# Patient Record
Sex: Male | Born: 1960 | Race: White | Hispanic: No | Smoking: Never smoker
Health system: Southern US, Community
[De-identification: ages and names within clinical notes are randomized; demographics above are authoritative.]

## PROBLEM LIST (undated history)

## (undated) DIAGNOSIS — E119 Type 2 diabetes mellitus without complications: Secondary | ICD-10-CM

## (undated) HISTORY — PX: FRACTURE SURGERY: SHX138

## (undated) HISTORY — DX: Type 2 diabetes mellitus without complications: E11.9

---

## 2006-02-22 ENCOUNTER — Inpatient Hospital Stay: Payer: Self-pay | Admitting: General Practice

## 2006-02-22 ENCOUNTER — Other Ambulatory Visit: Payer: Self-pay

## 2006-04-19 ENCOUNTER — Inpatient Hospital Stay: Payer: Self-pay | Admitting: General Practice

## 2006-04-19 ENCOUNTER — Other Ambulatory Visit: Payer: Self-pay

## 2006-05-10 ENCOUNTER — Encounter: Payer: Self-pay | Admitting: Internal Medicine

## 2006-06-01 ENCOUNTER — Encounter: Payer: Self-pay | Admitting: Internal Medicine

## 2006-07-02 ENCOUNTER — Encounter: Payer: Self-pay | Admitting: Internal Medicine

## 2006-10-08 ENCOUNTER — Encounter: Payer: Self-pay | Admitting: General Practice

## 2006-11-01 ENCOUNTER — Encounter: Payer: Self-pay | Admitting: General Practice

## 2006-11-12 ENCOUNTER — Ambulatory Visit: Payer: Self-pay | Admitting: General Practice

## 2006-11-12 IMAGING — US US EXTREM LOW VENOUS*R*
1 series · 18 of 19 positions shown · non-contrast
Comparison: none

REASON FOR EXAM: Right leg swelling, evaluate for DVT
       Call report to: [[email or web address]_NUMBER]
COMMENTS:

PROCEDURE:     US  - US DOPPLER LOW EXTR RIGHT  - [DATE]  [DATE]
RESULT:     The RIGHT femoral and popliteal veins are normally compressible.
The waveform patterns are normal and the color flow images are normal.

[Series 1: us extrem low venous*right* · 18 of 19 slices shown]
[im 1/19]
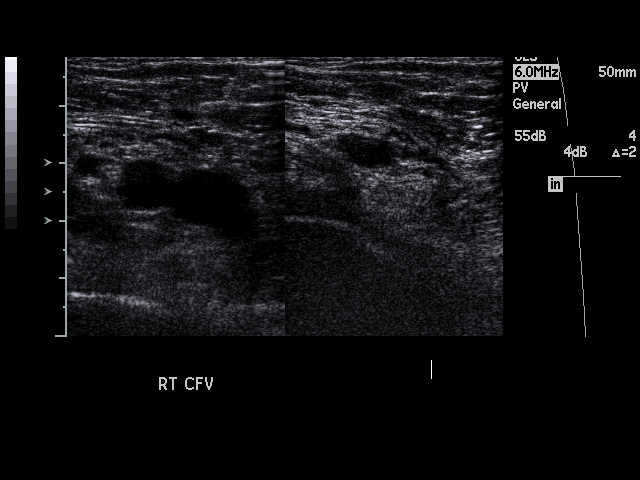
[im 2/19]
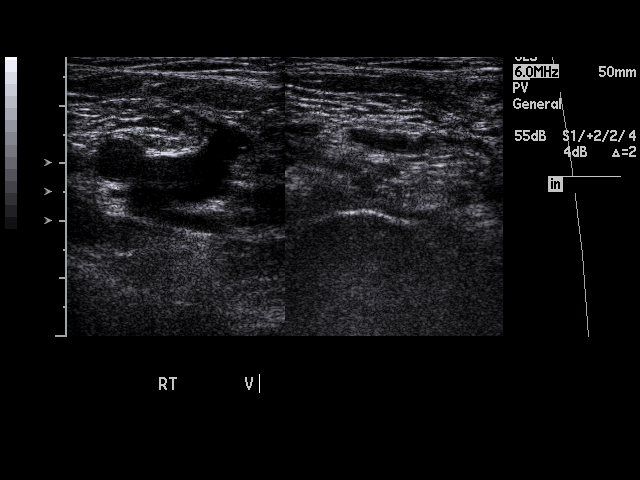
[im 3/19]
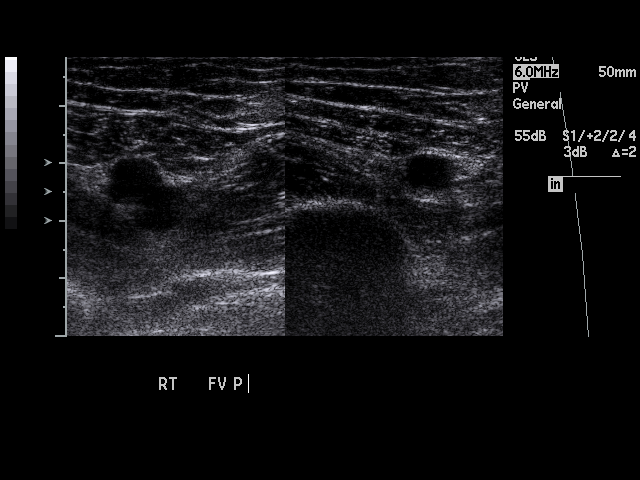
[im 4/19]
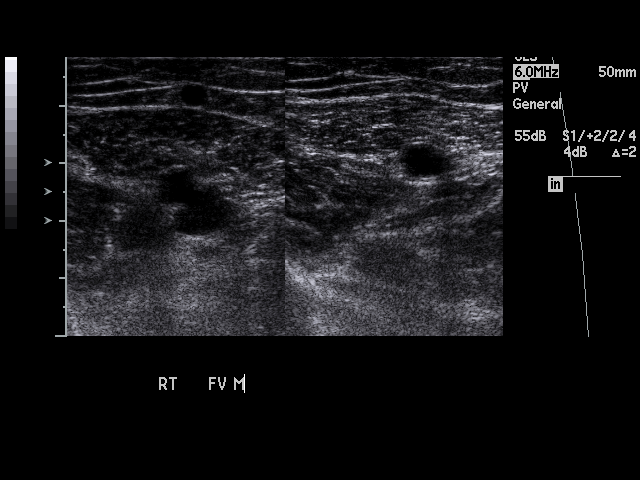
[im 5/19]
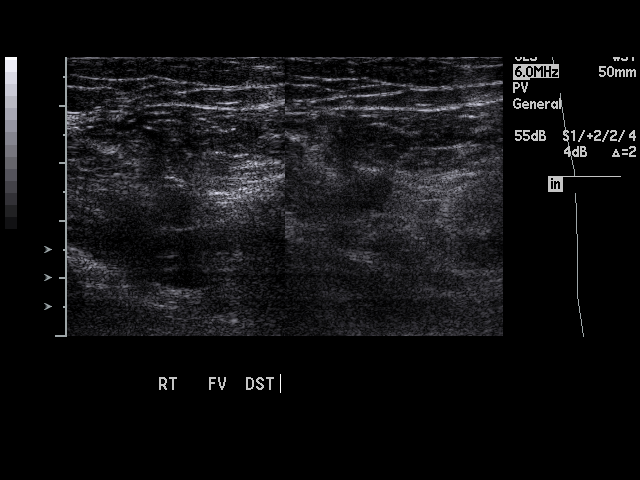
[im 6/19]
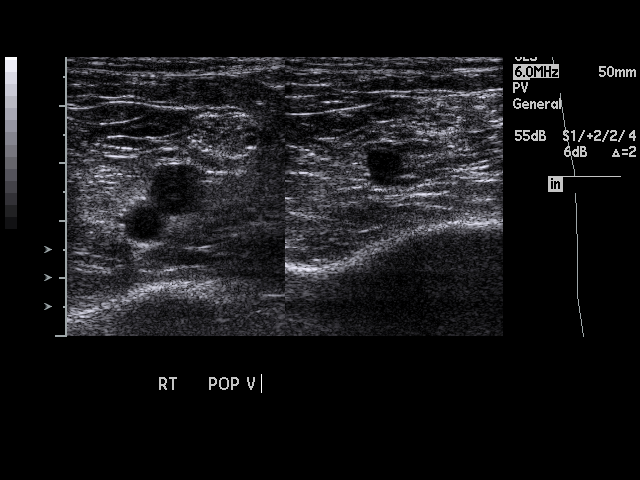
[im 7/19]
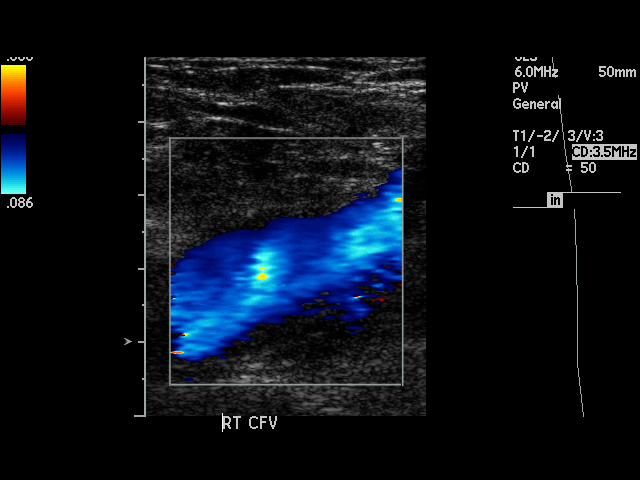
[im 8/19]
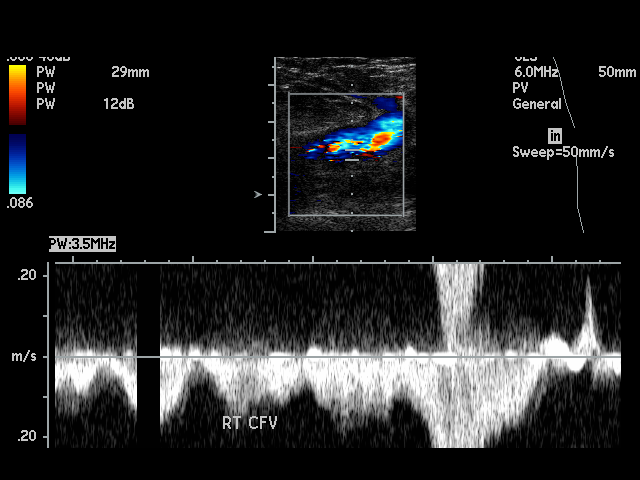
[im 9/19]
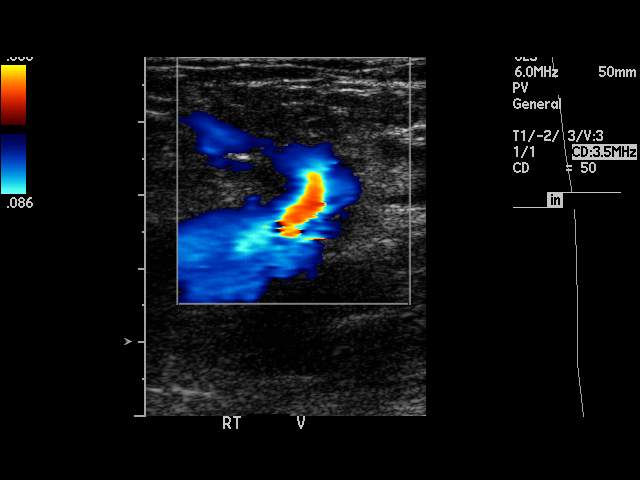
[im 11/19]
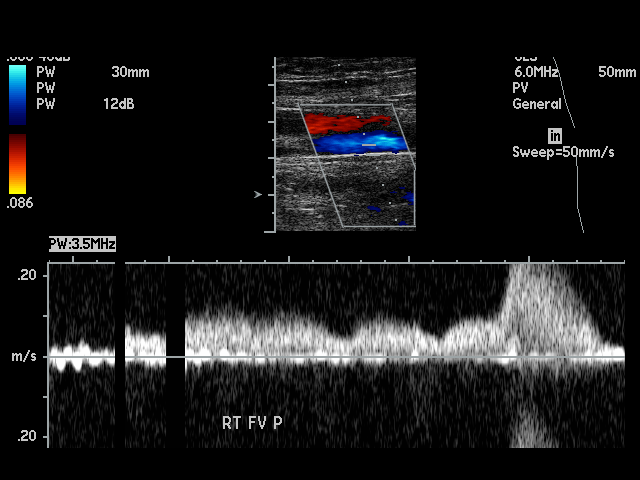
[im 12/19]
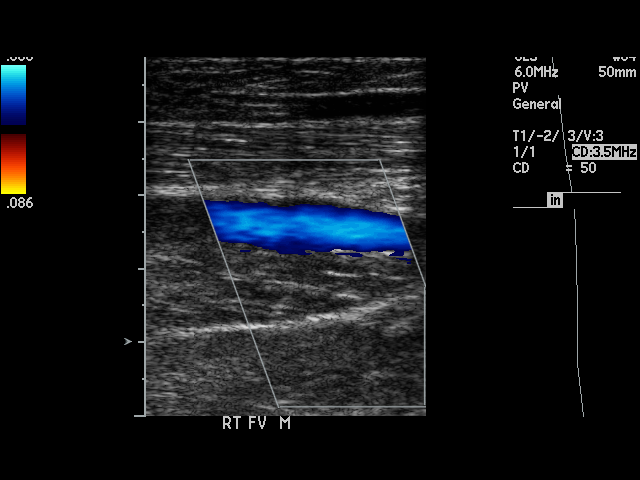
[im 13/19]
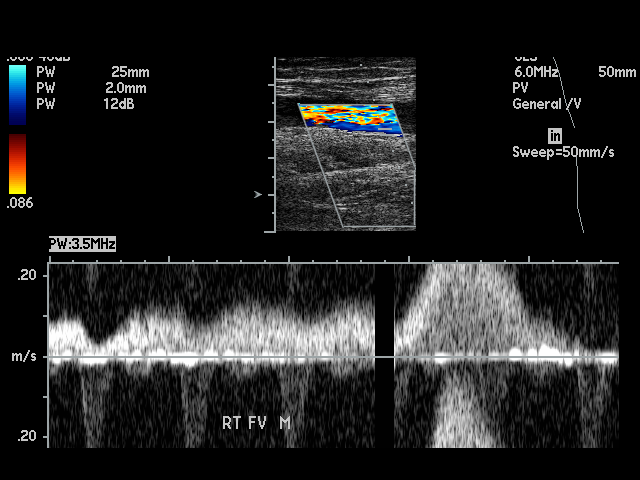
[im 14/19]
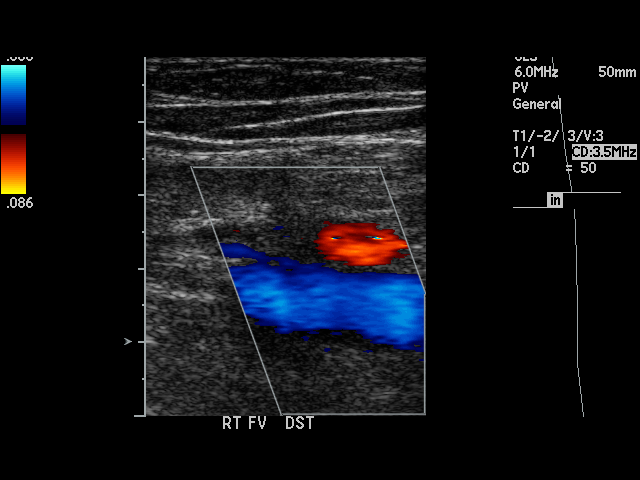
[im 15/19]
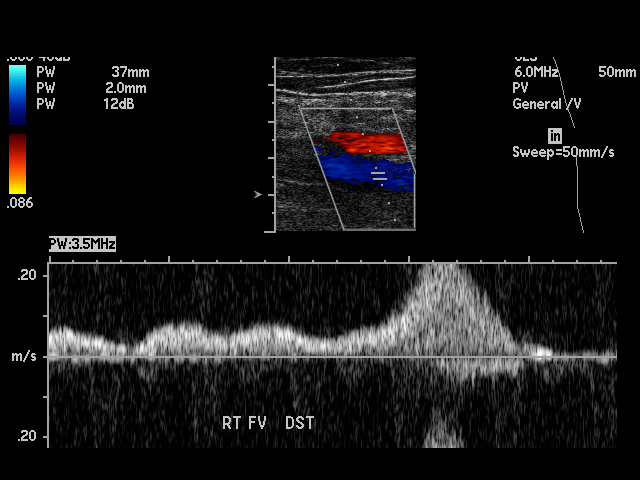
[im 16/19]
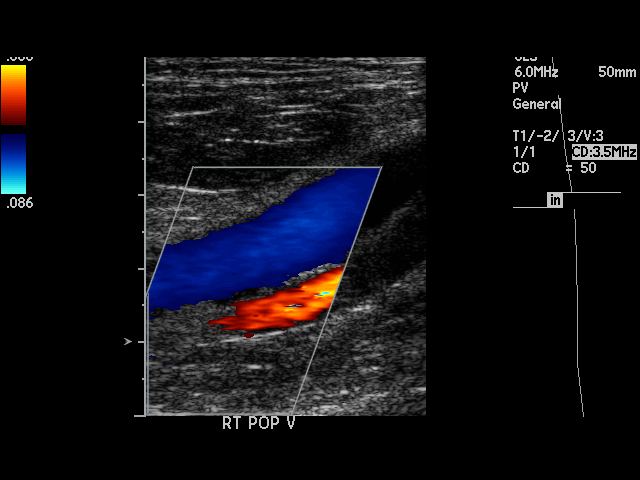
[im 17/19]
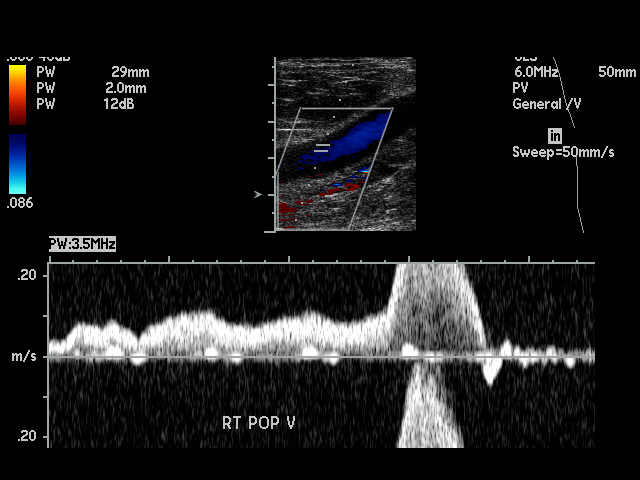
[im 18/19]
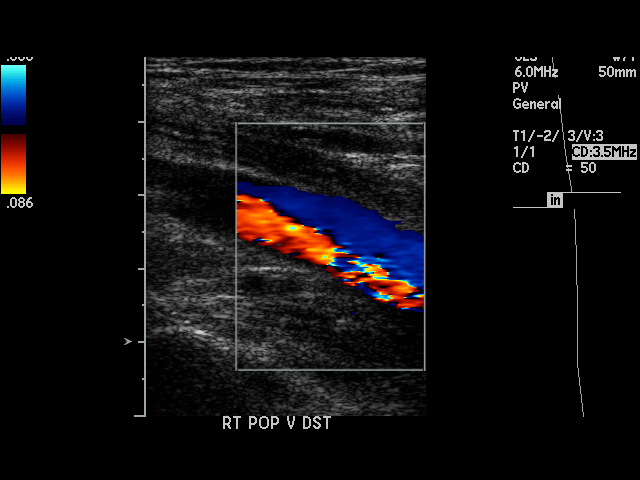
[im 19/19]
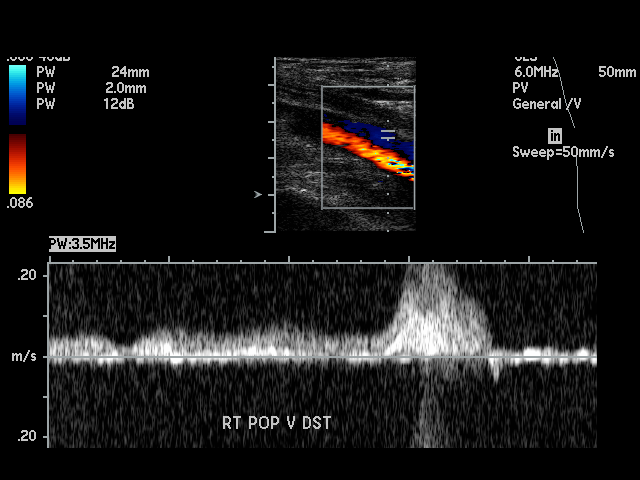

[18 of 19 positions shown; findings below may reference images not displayed]

IMPRESSION: I do not see evidence of thrombus within the RIGHT femoral
or popliteal veins.

## 2006-12-02 ENCOUNTER — Encounter: Payer: Self-pay | Admitting: General Practice

## 2007-01-01 ENCOUNTER — Encounter: Payer: Self-pay | Admitting: General Practice

## 2007-02-07 ENCOUNTER — Ambulatory Visit: Payer: Self-pay | Admitting: Podiatry

## 2022-02-11 ENCOUNTER — Inpatient Hospital Stay
Admission: EM | Admit: 2022-02-11 | Discharge: 2022-02-14 | DRG: 481 | Disposition: A | Discharge status: Skilled Nursing Facility | Payer: Medicare Other | Attending: Internal Medicine | Admitting: Internal Medicine

## 2022-02-11 ENCOUNTER — Emergency Department: Payer: Medicare Other

## 2022-02-11 ENCOUNTER — Encounter: Payer: Self-pay | Admitting: Internal Medicine

## 2022-02-11 ENCOUNTER — Other Ambulatory Visit: Payer: Self-pay

## 2022-02-11 ENCOUNTER — Inpatient Hospital Stay: Payer: Medicare Other

## 2022-02-11 DIAGNOSIS — S72141A Displaced intertrochanteric fracture of right femur, initial encounter for closed fracture: Principal | ICD-10-CM | POA: Diagnosis present

## 2022-02-11 DIAGNOSIS — E876 Hypokalemia: Secondary | ICD-10-CM | POA: Diagnosis present

## 2022-02-11 DIAGNOSIS — R739 Hyperglycemia, unspecified: Secondary | ICD-10-CM | POA: Diagnosis present

## 2022-02-11 DIAGNOSIS — Y92008 Other place in unspecified non-institutional (private) residence as the place of occurrence of the external cause: Secondary | ICD-10-CM | POA: Diagnosis not present

## 2022-02-11 DIAGNOSIS — R7401 Elevation of levels of liver transaminase levels: Secondary | ICD-10-CM | POA: Diagnosis not present

## 2022-02-11 DIAGNOSIS — S72001A Fracture of unspecified part of neck of right femur, initial encounter for closed fracture: Principal | ICD-10-CM

## 2022-02-11 DIAGNOSIS — D62 Acute posthemorrhagic anemia: Secondary | ICD-10-CM | POA: Diagnosis not present

## 2022-02-11 DIAGNOSIS — W19XXXA Unspecified fall, initial encounter: Secondary | ICD-10-CM | POA: Diagnosis present

## 2022-02-11 DIAGNOSIS — E119 Type 2 diabetes mellitus without complications: Secondary | ICD-10-CM | POA: Diagnosis not present

## 2022-02-11 DIAGNOSIS — E663 Overweight: Secondary | ICD-10-CM | POA: Diagnosis present

## 2022-02-11 DIAGNOSIS — E1165 Type 2 diabetes mellitus with hyperglycemia: Secondary | ICD-10-CM | POA: Diagnosis present

## 2022-02-11 DIAGNOSIS — L97519 Non-pressure chronic ulcer of other part of right foot with unspecified severity: Secondary | ICD-10-CM | POA: Diagnosis present

## 2022-02-11 DIAGNOSIS — E11621 Type 2 diabetes mellitus with foot ulcer: Secondary | ICD-10-CM | POA: Diagnosis present

## 2022-02-11 DIAGNOSIS — R17 Unspecified jaundice: Secondary | ICD-10-CM | POA: Diagnosis present

## 2022-02-11 DIAGNOSIS — Z6827 Body mass index (BMI) 27.0-27.9, adult: Secondary | ICD-10-CM

## 2022-02-11 DIAGNOSIS — W010XXA Fall on same level from slipping, tripping and stumbling without subsequent striking against object, initial encounter: Secondary | ICD-10-CM | POA: Diagnosis present

## 2022-02-11 LAB — CBC WITH DIFFERENTIAL/PLATELET
Abs Immature Granulocytes: 0.06 10*3/uL (ref 0.00–0.07)
Basophils Absolute: 0.1 10*3/uL (ref 0.0–0.1)
Basophils Relative: 1 %
Eosinophils Absolute: 0 10*3/uL (ref 0.0–0.5)
Eosinophils Relative: 0 %
HCT: 37.1 % — ABNORMAL LOW (ref 39.0–52.0)
Hemoglobin: 13.5 g/dL (ref 13.0–17.0)
Immature Granulocytes: 1 %
Lymphocytes Relative: 12 %
Lymphs Abs: 1.1 10*3/uL (ref 0.7–4.0)
MCH: 34 pg (ref 26.0–34.0)
MCHC: 36.4 g/dL — ABNORMAL HIGH (ref 30.0–36.0)
MCV: 93.5 fL (ref 80.0–100.0)
Monocytes Absolute: 0.9 10*3/uL (ref 0.1–1.0)
Monocytes Relative: 9 %
Neutro Abs: 7.2 10*3/uL (ref 1.7–7.7)
Neutrophils Relative %: 77 %
Platelets: 260 10*3/uL (ref 150–400)
RBC: 3.97 MIL/uL — ABNORMAL LOW (ref 4.22–5.81)
RDW: 13.3 % (ref 11.5–15.5)
WBC: 9.3 10*3/uL (ref 4.0–10.5)
nRBC: 0 % (ref 0.0–0.2)

## 2022-02-11 LAB — COMPREHENSIVE METABOLIC PANEL
ALT: 111 U/L — ABNORMAL HIGH (ref 0–44)
AST: 60 U/L — ABNORMAL HIGH (ref 15–41)
Albumin: 4.3 g/dL (ref 3.5–5.0)
Alkaline Phosphatase: 73 U/L (ref 38–126)
Anion gap: 15 (ref 5–15)
BUN: 18 mg/dL (ref 8–23)
CO2: 23 mmol/L (ref 22–32)
Calcium: 9.6 mg/dL (ref 8.9–10.3)
Chloride: 94 mmol/L — ABNORMAL LOW (ref 98–111)
Creatinine, Ser: 0.78 mg/dL (ref 0.61–1.24)
GFR, Estimated: >60 mL/min (ref >60)
Glucose, Bld: 173 mg/dL — ABNORMAL HIGH (ref 70–99)
Potassium: 3.7 mmol/L (ref 3.5–5.1)
Sodium: 132 mmol/L — ABNORMAL LOW (ref 135–145)
Total Bilirubin: 2.9 mg/dL — ABNORMAL HIGH (ref 0.3–1.2)
Total Protein: 7.4 g/dL (ref 6.5–8.1)

## 2022-02-11 LAB — GLUCOSE, CAPILLARY: Glucose-Capillary: 134 mg/dL — ABNORMAL HIGH (ref 70–99)

## 2022-02-11 LAB — CK: Total CK: 147 U/L (ref 49–397)

## 2022-02-11 LAB — CBG MONITORING, ED: Glucose-Capillary: 153 mg/dL — ABNORMAL HIGH (ref 70–99)

## 2022-02-11 MED ORDER — SENNA 8.6 MG PO TABS
1.0000 | ORAL_TABLET | Freq: Two times a day (BID) | ORAL | Status: DC
  Administered 2022-02-13 – 2022-02-14 (×3): 8.6 mg via ORAL
  Filled 2022-02-11 (×5): qty 1

## 2022-02-11 MED ORDER — HYDROMORPHONE HCL 1 MG/ML IJ SOLN
1.0000 mg | Freq: Once | INTRAMUSCULAR | Status: AC
  Administered 2022-02-11: 1 mg via INTRAVENOUS
  Filled 2022-02-11: qty 1

## 2022-02-11 MED ORDER — AMLODIPINE BESYLATE 5 MG PO TABS
5.0000 mg | ORAL_TABLET | Freq: Every day | ORAL | Status: DC
  Administered 2022-02-11 – 2022-02-14 (×3): 5 mg via ORAL
  Filled 2022-02-11 (×3): qty 1

## 2022-02-11 MED ORDER — MORPHINE SULFATE (PF) 2 MG/ML IV SOLN
0.5000 mg | INTRAVENOUS | Status: DC | PRN
  Administered 2022-02-11 – 2022-02-12 (×2): 0.5 mg via INTRAVENOUS
  Filled 2022-02-11 (×2): qty 1

## 2022-02-11 MED ORDER — HYDROMORPHONE HCL 1 MG/ML IJ SOLN
0.5000 mg | Freq: Once | INTRAMUSCULAR | Status: AC
  Administered 2022-02-11: 0.5 mg via INTRAVENOUS
  Filled 2022-02-11: qty 0.5

## 2022-02-11 MED ORDER — ONDANSETRON HCL 4 MG/2ML IJ SOLN
4.0000 mg | Freq: Once | INTRAMUSCULAR | Status: AC
  Administered 2022-02-11: 4 mg via INTRAVENOUS
  Filled 2022-02-11: qty 2

## 2022-02-11 MED ORDER — HYDROCODONE-ACETAMINOPHEN 5-325 MG PO TABS
1.0000 | ORAL_TABLET | Freq: Four times a day (QID) | ORAL | Status: DC | PRN
  Administered 2022-02-11 – 2022-02-12 (×2): 2 via ORAL
  Filled 2022-02-11 (×3): qty 2

## 2022-02-11 MED ORDER — CEFAZOLIN SODIUM-DEXTROSE 2-4 GM/100ML-% IV SOLN
2.0000 g | INTRAVENOUS | Status: AC
  Administered 2022-02-12: 2 g via INTRAVENOUS

## 2022-02-11 MED ORDER — METHOCARBAMOL 500 MG PO TABS
500.0000 mg | ORAL_TABLET | Freq: Four times a day (QID) | ORAL | Status: DC | PRN
  Administered 2022-02-11 – 2022-02-14 (×4): 500 mg via ORAL
  Filled 2022-02-11 (×4): qty 1

## 2022-02-11 MED ORDER — METHOCARBAMOL 1000 MG/10ML IJ SOLN: 500.0000 mg | Freq: Four times a day (QID) | INTRAVENOUS | Status: DC | PRN

## 2022-02-11 NOTE — Assessment & Plan Note (Signed)
Patient noted to have transaminitis of unclear etiology.  Unclear etiology.  Mildly elevated with AST of 60 and ALT of 111 on admission.  Right upper quadrant ultrasound unremarkable.  We will recheck labs in the morning.

## 2022-02-11 NOTE — Consult Note (Signed)
ORTHOPAEDIC CONSULTATION  REQUESTING PHYSICIAN: Lucile Shutters, MD  Chief Complaint:   Right hip pain.  History of Present Illness: Terrence Sanchez is a 61 y.o. 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  Apparently, the patient lost his balance and fell onto his right side 1 week ago.  He did not seek treatment immediately, hoping that "it would get better on its own".  However, because of continued pain and inability to ambulate, he was brought to the emergency room where x-rays demonstrated a displaced intertrochanteric fracture of the right hip.  The patient does note some anterolateral right knee pain, but denies any other associated injuries resulting from the fall.  He did not strike his head or lose consciousness.  Past Medical History:  Diagnosis Date   Diabetes mellitus without complication (HCC)    Past Surgical History:  Procedure Laterality Date   FRACTURE SURGERY     Social History   Socioeconomic History   Marital status: Unknown    Spouse name: Not on file   Number of children: Not on file   Years of education: Not on file   Highest education level: Not on file  Occupational History   Not on file  Tobacco Use   Smoking status: Never   Smokeless tobacco: Never  Substance and Sexual Activity   Alcohol use: Yes    Comment: Occasional   Drug use: Not on file   Sexual activity: Not on file  Other Topics Concern   Not on file  Social History Narrative   Not on file   Social Determinants of Health   Financial Resource Strain: Not on file  Food Insecurity: Not on file  Transportation Needs: Not on file  Physical Activity: Not on file  Stress: Not on file  Social Connections: Not on file   History reviewed. No pertinent family history. No Known  Allergies Prior to Admission medications   Not on File   DG Knee Complete 4 Views Right  Result Date: 02/11/2022 CLINICAL DATA:  Fall 1 week ago.  Right hip and femur region pain. EXAM: RIGHT KNEE - COMPLETE 4+ VIEW COMPARISON:  None Available. FINDINGS: No fracture.  No bone lesion. Knee joint normally spaced and aligned. No significant degenerative/arthropathic changes. No joint effusion. Soft tissues are unremarkable. IMPRESSION: Negative. Electronically Signed   By: Amie Portland M.D.   On: 02/11/2022 13:41   DG Chest Portable 1 View  Result Date: 02/11/2022 CLINICAL DATA:  Fall 1 week ago.  Right hip fracture. EXAM: PORTABLE CHEST 1 VIEW COMPARISON:  02/25/2006. FINDINGS: Cardiac silhouette is normal in size. Normal mediastinal and hilar contours. Clear lungs.  No pleural effusion or pneumothorax. Skeletal structures are grossly intact. IMPRESSION: No active disease. Electronically Signed   By: Amie Portland M.D.   On: 02/11/2022 13:40   DG Hip Unilat W or Wo Pelvis 2-3 Views Right  Result Date: 02/11/2022 CLINICAL DATA:  Pt reports falling 1 week ago, denies striking head. Since fall patient has not been able to ambulate around his house and has been confined to his couch. Pt reports right hip pain, right femur pain. EXAM: DG HIP (WITH OR WITHOUT PELVIS) 2-3V RIGHT COMPARISON:  None Available. FINDINGS: Comminuted intertrochanteric fracture of the proximal right femur. Primary fracture components are mildly displaced, by approximately 1.5 cm. There is varus and apex posterior  angulation. No other fractures. No bone lesions. Hip joints, SI joints and pubic symphysis are normally aligned. Soft tissues are unremarkable. IMPRESSION: 1. Intertrochanteric, comminuted fracture of the right proximal femur with varus angulation. No dislocation. Electronically Signed   By: Amie Portland M.D.   On: 02/11/2022 13:40    Positive ROS: All other systems have been reviewed and were otherwise negative with  the exception of those mentioned in the HPI and as above.  Physical Exam: General:  Alert, no acute distress Psychiatric:  Patient is competent for consent with normal mood and affect   Cardiovascular:  No pedal edema Respiratory:  No wheezing, non-labored breathing GI:  Abdomen is soft and non-tender Skin:  No lesions in the area of chief complaint Neurologic:  Sensation intact distally Lymphatic:  No axillary or cervical lymphadenopathy  Orthopedic Exam:  Orthopedic examination is limited to the right hip and lower extremity.  The right lower extremity is obviously shortened and externally rotated as compared to the left lower extremity.  Skin inspection of the right hip and thigh region is notable for moderate swelling.  In addition, two areas of full-thickness skin burns/eschars and several other smaller blisters with surrounding erythema are noted along the lateral aspect of the thigh due to the patient burning himself on a heating pad several days ago.  The patient has moderate pain to palpation over the anterolateral aspect of the hip.  He has more severe pain with any attempted active or passive motion of the hip.  More distally, the right foot and ankle is notable for obvious deformity.  The foot exhibits a cavo-varus deformity while the ankle is fused.  There is a large area of skin discoloration over the medial aspect of the ankle due to his prior open fracture and multiple subsequent surgeries.  Sensation is grossly intact to light touch over the dorsal and plantar aspects of the foot.  Capillary refill is satisfactory to all digits.  X-rays:  Recent x-rays of the pelvis and right hip are available for review and have been reviewed by myself.  The findings are as described above.  These films demonstrate a displaced three-part intertrochanteric fracture and varus alignment.  The hip joint itself appears to be well-maintained and without evidence for significant degenerative changes.  No  other acute bony abnormalities are identified.  Assessment: Closed displaced three-part intertrochanteric fracture, right hip.  Plan: The treatment options, including both surgical and nonsurgical choices, have been discussed in detail with the patient.  The patient would like to proceed with surgical intervention to include a reduction and intramedullary nailing of the displaced three-part intertrochanteric right hip fracture.  The risks (including bleeding, infection, nerve and/or blood vessel injury, persistent or recurrent pain, loosening or failure of the components, leg length inequality, dislocation, need for further surgery, blood clots, strokes, heart attacks or arrhythmias, pneumonia, etc.) and benefits of the surgical procedure were discussed.  The patient states his understanding and agrees to proceed.  He agrees to a blood transfusion if necessary.  A formal written consent will be obtained by the nursing staff.  Thank you for asking me to participate in the care of this most pleasant yet unfortunate man.  I will be happy to follow him with you.   Maryagnes Amos, MD  Beeper #:  [phone]  02/11/2022 3:17 PM

## 2022-02-11 NOTE — H&P (Addendum)
History and Physical    Patient: Terrence Sanchez [id number] DOB: 1961/01/23 DOA: 02/11/2022 DOS: the patient was seen and examined on 02/11/2022 PCP: No primary care provider on file.  Patient coming from: Home  Chief Complaint:  Chief Complaint  Patient presents with   Fall   HPI: Terrence Sanchez is a 61 y.o. male with medical history significant for diabetes mellitus not on any medications (diet-controlled), history of multiple failed right ankle surgeries for ankle fracture and ambulates with 2 crutches and a boot who presents to the ER via EMS for evaluation of pain in his right hip following a fall onto his right side about a week ago. Patient states that he tripped and fell 1 week prior to his admission and denies any loss of consciousness or head trauma.  He denied feeling dizzy or lightheaded prior to the fall.  He was unable to get up or bear weight on his right leg and had a friend assist him to his couch where he has been confined for the last 1 week. He has been to taking ibuprofen for pain control at home and states that he initially had some pain relief but now has refractory pain in his right hip which he rates an 8 x 10 in intensity at its worst. He denies having any chest pain, no shortness of breath, no nausea, no vomiting, no headache, no cough, no fever, no chills, no changes in his bowel habits, no urinary symptoms, no blurred vision, no focal deficit. He had a right hip x-Zackary which showed intertrochanteric, comminuted fracture of the right proximal femur with varus angulation. No dislocation. Labs show transaminitis of unclear etiology He will be admitted to the hospital for further evaluation.     Review of Systems: As mentioned in the history of present illness. All other systems reviewed and are negative. Past Medical History:  Diagnosis Date   Diabetes mellitus without complication [facility])    Past Surgical History:  Procedure Laterality Date   FRACTURE SURGERY      Social History:  reports that he has never smoked. He has never used smokeless tobacco. He reports current alcohol use. No history on file for drug use.  No Known Allergies  History reviewed. No pertinent family history.  Prior to Admission medications   Not on File    Physical Exam: Vitals:   02/11/22 1345 02/11/22 1400 02/11/22 1430 02/11/22 1433  BP: (!) 158/99 (!) 172/95 (!) 154/92 (!) 154/92  Pulse: (!) 108 (!) 102 (!) 101 95  Resp: 13 14 (!) 23 (!) 21  Temp:      SpO2: 92% 94% 96% 98%  Weight:      Height:       Physical Exam Vitals and nursing note reviewed.  Constitutional:      Appearance: Normal appearance.  HENT:     Head: Normocephalic and atraumatic.     Nose: Nose normal.     Mouth/Throat:     Mouth: Mucous membranes are moist.  Eyes:     Conjunctiva/sclera: Conjunctivae normal.  Cardiovascular:     Rate and Rhythm: Tachycardia present.  Pulmonary:     Effort: Pulmonary effort is normal.     Breath sounds: Normal breath sounds.  Abdominal:     General: Abdomen is flat. Bowel sounds are normal.     Palpations: Abdomen is soft.  Musculoskeletal:        General: Deformity present.     Cervical back: Normal range of motion and neck  supple.     Comments: Decreased range of motion right hip and right ankle.  Right leg appears shortened.  Ulcerated area over the plantar surface of the right foot(patient declines a podiatry consult) Chronic hyperpigmentation involving the right ankle  Skin:    General: Skin is warm and dry.  Neurological:     General: No focal deficit present.     Mental Status: He is alert and oriented to person, place, and time.  Psychiatric:        Mood and Affect: Mood normal.        Behavior: Behavior normal.     Data Reviewed: Relevant notes from primary care and specialist visits, past discharge summaries as available in EHR, including Care Everywhere. Prior diagnostic testing as pertinent to current admission  diagnoses Updated medications and problem lists for reconciliation ED course, including vitals, labs, imaging, treatment and response to treatment Triage notes, nursing and pharmacy notes and ED provider's notes Notable results as noted in HPI Labs reviewed.  Sodium 132, potassium 3.7, chloride 94, bicarb 23, glucose 173, BUN 18, creatinine 0.7, calcium 9.6, total protein 7.4, albumin 4.3, AST 60, ALT 111, alkaline phosphatase 73, total bilirubin 2.9, total CK1 47, white count 9.3, hemoglobin 13.5, hematocrit 37.1, platelet count 260 Chest x-Alter reviewed by me shows no evidence of acute cardiopulmonary disease Right knee x-rays negative for acute fracture Twelve-lead EKG reviewed by me shows sinus tachycardia with left axis deviation There are no new results to review at this time.  Assessment and Plan: * Intertrochanteric fracture of right femur, closed, initial encounter ([place]) Status post fall with intertrochanteric, comminuted fracture of the right proximal femur with varus angulation.  Immobilize right lower extremity Pain control Muscle relaxants Orthopedic surgery consult  Fall Status post fall with intertrochanteric comminuted fracture of the right proximal femur Place patient on fall precautions PT evaluate and treat once appropriate  Transaminitis Patient noted to have transaminitis of unclear etiology but concerning for possible hepatic steatosis from known diabetes mellitus Obtain right upper quadrant ultrasound  Controlled type 2 diabetes mellitus without complication, without long-term current use of insulin ([place]) Patient states that he has a history of diabetes mellitus but is not on any medication (diet-controlled) States that his blood sugars have been well controlled after he lost about 35 pounds Maintain consistent carbohydrate diet Blood sugar checks with meals      Advance Care Planning:   Code Status: Full Code   Consults: Orthopedic surgery  Family  Communication: Greater than 50% of time was spent discussing patient's condition and plan of care with him at the bedside.  All questions and concerns have been addressed.  He verbalizes understanding and agrees to the plan.  Severity of Illness: The appropriate patient status for this patient is INPATIENT. Inpatient status is judged to be reasonable and necessary in order to provide the required intensity of service to ensure the patient's safety. The patient's presenting symptoms, physical exam findings, and initial radiographic and laboratory data in the context of their chronic comorbidities is felt to place them at high risk for further clinical deterioration. Furthermore, it is not anticipated that the patient will be medically stable for discharge from the hospital within 2 midnights of admission.   * I certify that at the point of admission it is my clinical judgment that the patient will require inpatient hospital care spanning beyond 2 midnights from the point of admission due to high intensity of service, high risk for further deterioration and  high frequency of surveillance required.*  Author: Lucile Shutters, MD 02/11/2022 3:06 PM  For on call review [email or web address].

## 2022-02-11 NOTE — ED Notes (Signed)
Admitting md at bedside

## 2022-02-11 NOTE — ED Notes (Signed)
US at bedside

## 2022-02-11 NOTE — Assessment & Plan Note (Signed)
Status post fall with intertrochanteric comminuted fracture of the right proximal femur Place patient on fall precautions PT evaluate and treat once appropriate

## 2022-02-11 NOTE — Assessment & Plan Note (Signed)
Patient states that he has a history of diabetes mellitus but is not on any medication (diet-controlled).  Will check A1c and for now, sliding scale

## 2022-02-11 NOTE — ED Triage Notes (Signed)
BIBA from home. Pt reports falling 1 week ago, denies striking head. Since fall patient has not been able to ambulate around his house and has been confined to his couch.   Pt reports right hip pain, right femur pain. Pt with chronic right foot problems and uses crutches and splint at home.   Pt reports he was treating at home with heating pad and believes he may have burned his right hip area.

## 2022-02-11 NOTE — Assessment & Plan Note (Signed)
Status post fall with intertrochanteric, comminuted fracture of the right proximal femur with varus angulation.  Immobilize right lower extremity Pain control Muscle relaxants Orthopedic surgery consult

## 2022-02-11 NOTE — ED Provider Notes (Signed)
[facility] Provider Note    Event Date/Time   First MD Initiated Contact with Patient 02/11/22 1230     (approximate)   History   Fall   HPI  Terrence Sanchez is a 61 y.o. male with history of right leg ankle issues years ago status post surgery by Dr. Ether Griffins  currently still in a boot and uses crutches for the past few years who comes with concerns for leg pain.  She reports having a fall over a week ago onto his right hip like he never has needed to ambulate since then.  He had a friend get him onto a couch that he has been sitting there for a week.  He states he did not hit his head did not lose consciousness has no neck pain.  His pain is only in the right hip going down into his right knee  Physical Exam   Triage Vital Signs: ED Triage Vitals [02/11/22 1236]  Enc Vitals Group     BP (!) 178/102     Pulse      Resp 20     Temp      Temp src      SpO2      Weight      Height      Head Circumference      Peak Flow      Pain Score      Pain Loc      Pain Edu?      Excl. in GC?     Most recent vital signs: Vitals:   02/11/22 1236  BP: (!) 178/102  Resp: 20     General: Awake, no distress.  CV:  Good peripheral perfusion.  Resp:  Normal effort.  Abd:  No distention.  Other:  Patient is tenderness of the right hip and the right knee unable to lift the leg up off the bed.  Patient has a pulse dopplered and marked. Burn wounds noted on leg.    ED Results / Procedures / Treatments   Labs (all labs ordered are listed, but only abnormal results are displayed) Labs Reviewed  CBC WITH DIFFERENTIAL/PLATELET - Abnormal; Notable for the following components:      Result Value   RBC 3.97 (*)    HCT 37.1 (*)    MCHC 36.4 (*)    All other components within normal limits  COMPREHENSIVE METABOLIC PANEL - Abnormal; Notable for the following components:   Sodium 132 (*)    Chloride 94 (*)    Glucose, Bld 173 (*)    AST 60 (*)    ALT 111 (*)     Total Bilirubin 2.9 (*)    All other components within normal limits  CK  HIV ANTIBODY (ROUTINE TESTING W REFLEX)     EKG  My interpretation of EKG:  Sinus tachycardia rate of 102 without any ST elevation or T wave inversions except for aVL, normal intervals  RADIOLOGY I have reviewed the xray personally and interpreted patient's right hip fracture  PROCEDURES:  Critical Care performed: No  Procedures   MEDICATIONS ORDERED IN ED: Medications - No data to display   IMPRESSION / MDM / ASSESSMENT AND PLAN / ED COURSE  I reviewed the triage vital signs and the nursing notes.   Patient's presentation is most consistent with acute presentation with potential threat to life or bodily function.   Patient comes in with concerns for fall with right hip pain.  X-rays ordered  evaluate for any fractures.  Vascular intact.  Labs ordered given concern for falls evaluate for rhabdo, AKI  CK normal.  CBC reassuring.  CMP slightly elevated LFTs.  Discussed with Dr. Joice Lofts we will plan for surgery.  Will admit to the hospital team    The patient is on the cardiac monitor to evaluate for evidence of arrhythmia and/or significant heart rate changes.      FINAL CLINICAL IMPRESSION(S) / ED DIAGNOSES   Final diagnoses:  Closed fracture of right hip, initial encounter [facility])     Rx / DC Orders   ED Discharge Orders     None        Note:  This document was prepared using Dragon voice recognition software and may include unintentional dictation errors.   Concha Se, MD 02/11/22 [phone]

## 2022-02-12 ENCOUNTER — Inpatient Hospital Stay: Payer: Medicare Other

## 2022-02-12 ENCOUNTER — Other Ambulatory Visit: Payer: Self-pay

## 2022-02-12 ENCOUNTER — Encounter: Payer: Self-pay | Admitting: Internal Medicine

## 2022-02-12 ENCOUNTER — Inpatient Hospital Stay: Payer: Medicare Other | Admitting: Anesthesiology

## 2022-02-12 ENCOUNTER — Encounter
Admission: EM | Disposition: A | Discharge status: Skilled Nursing Facility | Payer: Self-pay | Source: Home / Self Care | Attending: Internal Medicine

## 2022-02-12 DIAGNOSIS — E663 Overweight: Secondary | ICD-10-CM | POA: Diagnosis present

## 2022-02-12 DIAGNOSIS — R7401 Elevation of levels of liver transaminase levels: Secondary | ICD-10-CM | POA: Diagnosis not present

## 2022-02-12 DIAGNOSIS — E876 Hypokalemia: Secondary | ICD-10-CM

## 2022-02-12 DIAGNOSIS — S72141A Displaced intertrochanteric fracture of right femur, initial encounter for closed fracture: Secondary | ICD-10-CM | POA: Diagnosis not present

## 2022-02-12 DIAGNOSIS — E119 Type 2 diabetes mellitus without complications: Secondary | ICD-10-CM | POA: Diagnosis not present

## 2022-02-12 HISTORY — PX: INTRAMEDULLARY (IM) NAIL INTERTROCHANTERIC: SHX5875

## 2022-02-12 LAB — CBC
HCT: 33.8 % — ABNORMAL LOW (ref 39.0–52.0)
Hemoglobin: 12.3 g/dL — ABNORMAL LOW (ref 13.0–17.0)
MCH: 33.8 pg (ref 26.0–34.0)
MCHC: 36.4 g/dL — ABNORMAL HIGH (ref 30.0–36.0)
MCV: 92.9 fL (ref 80.0–100.0)
Platelets: 223 10*3/uL (ref 150–400)
RBC: 3.64 MIL/uL — ABNORMAL LOW (ref 4.22–5.81)
RDW: 13.2 % (ref 11.5–15.5)
WBC: 7.7 10*3/uL (ref 4.0–10.5)
nRBC: 0 % (ref 0.0–0.2)

## 2022-02-12 LAB — BASIC METABOLIC PANEL
Anion gap: 11 (ref 5–15)
BUN: 17 mg/dL (ref 8–23)
CO2: 25 mmol/L (ref 22–32)
Calcium: 9 mg/dL (ref 8.9–10.3)
Chloride: 95 mmol/L — ABNORMAL LOW (ref 98–111)
Creatinine, Ser: 0.8 mg/dL (ref 0.61–1.24)
GFR, Estimated: >60 mL/min (ref >60)
Glucose, Bld: 150 mg/dL — ABNORMAL HIGH (ref 70–99)
Potassium: 3.4 mmol/L — ABNORMAL LOW (ref 3.5–5.1)
Sodium: 131 mmol/L — ABNORMAL LOW (ref 135–145)

## 2022-02-12 LAB — GLUCOSE, CAPILLARY
Glucose-Capillary: 145 mg/dL — ABNORMAL HIGH (ref 70–99)
Glucose-Capillary: 148 mg/dL — ABNORMAL HIGH (ref 70–99)
Glucose-Capillary: 118 mg/dL — ABNORMAL HIGH (ref 70–99)
Glucose-Capillary: 103 mg/dL — ABNORMAL HIGH (ref 70–99)
Glucose-Capillary: 197 mg/dL — ABNORMAL HIGH (ref 70–99)

## 2022-02-12 LAB — TYPE AND SCREEN
ABO/RH(D): A POS
Antibody Screen: NEGATIVE

## 2022-02-12 LAB — HEMOGLOBIN A1C
Hgb A1c MFr Bld: 5.3 % (ref 4.8–5.6)
Mean Plasma Glucose: 105.41 mg/dL

## 2022-02-12 LAB — HIV ANTIBODY (ROUTINE TESTING W REFLEX): HIV Screen 4th Generation wRfx: NONREACTIVE

## 2022-02-12 LAB — SURGICAL PCR SCREEN
MRSA, PCR: NEGATIVE
Staphylococcus aureus: NEGATIVE

## 2022-02-12 SURGERY — FIXATION, FRACTURE, INTERTROCHANTERIC, WITH INTRAMEDULLARY ROD
Anesthesia: Spinal | Site: Leg Upper | Laterality: Right

## 2022-02-12 MED ORDER — DOCUSATE SODIUM 100 MG PO CAPS
100.0000 mg | ORAL_CAPSULE | Freq: Two times a day (BID) | ORAL | Status: DC
  Administered 2022-02-13 – 2022-02-14 (×3): 100 mg via ORAL
  Filled 2022-02-12 (×4): qty 1

## 2022-02-12 MED ORDER — SODIUM CHLORIDE 0.9 % IV SOLN: INTRAVENOUS | Status: DC

## 2022-02-12 MED ORDER — PROPOFOL 1000 MG/100ML IV EMUL
INTRAVENOUS | Status: AC
  Filled 2022-02-12: qty 100

## 2022-02-12 MED ORDER — ADULT MULTIVITAMIN W/MINERALS CH
1.0000 | ORAL_TABLET | Freq: Every day | ORAL | Status: DC
  Administered 2022-02-12 – 2022-02-14 (×3): 1 via ORAL
  Filled 2022-02-12 (×3): qty 1

## 2022-02-12 MED ORDER — ENOXAPARIN SODIUM 40 MG/0.4ML IJ SOSY
40.0000 mg | PREFILLED_SYRINGE | INTRAMUSCULAR | Status: DC
  Administered 2022-02-13 – 2022-02-14 (×2): 40 mg via SUBCUTANEOUS
  Filled 2022-02-12 (×2): qty 0.4

## 2022-02-12 MED ORDER — MIDAZOLAM HCL 2 MG/2ML IJ SOLN
INTRAMUSCULAR | Status: AC
  Filled 2022-02-12: qty 2

## 2022-02-12 MED ORDER — BUPIVACAINE-EPINEPHRINE (PF) 0.5% -1:200000 IJ SOLN
INTRAMUSCULAR | Status: AC
  Filled 2022-02-12: qty 30

## 2022-02-12 MED ORDER — 0.9 % SODIUM CHLORIDE (POUR BTL) OPTIME
TOPICAL | Status: DC | PRN
  Administered 2022-02-12: 500 mL

## 2022-02-12 MED ORDER — OXYCODONE HCL 5 MG PO TABS
5.0000 mg | ORAL_TABLET | ORAL | Status: DC | PRN
  Administered 2022-02-12 – 2022-02-14 (×8): 10 mg via ORAL
  Filled 2022-02-12 (×9): qty 2

## 2022-02-12 MED ORDER — MIDAZOLAM HCL 5 MG/5ML IJ SOLN
INTRAMUSCULAR | Status: DC | PRN
  Administered 2022-02-12 (×2): 1 mg via INTRAVENOUS

## 2022-02-12 MED ORDER — OXYCODONE HCL 5 MG PO TABS: 5.0000 mg | ORAL_TABLET | Freq: Once | ORAL | Status: DC | PRN

## 2022-02-12 MED ORDER — PROPOFOL 500 MG/50ML IV EMUL
INTRAVENOUS | Status: DC | PRN
  Administered 2022-02-12: 100 ug/kg/min via INTRAVENOUS

## 2022-02-12 MED ORDER — PHENYLEPHRINE 80 MCG/ML (10ML) SYRINGE FOR IV PUSH (FOR BLOOD PRESSURE SUPPORT)
PREFILLED_SYRINGE | INTRAVENOUS | Status: AC
  Filled 2022-02-12: qty 10

## 2022-02-12 MED ORDER — DEXMEDETOMIDINE HCL IN NACL 200 MCG/50ML IV SOLN
INTRAVENOUS | Status: DC | PRN
  Administered 2022-02-12: 8 ug via INTRAVENOUS
  Administered 2022-02-12: 4 ug via INTRAVENOUS

## 2022-02-12 MED ORDER — METOCLOPRAMIDE HCL 5 MG/ML IJ SOLN: 5.0000 mg | Freq: Three times a day (TID) | INTRAMUSCULAR | Status: DC | PRN

## 2022-02-12 MED ORDER — BISACODYL 10 MG RE SUPP: 10.0000 mg | Freq: Every day | RECTAL | Status: DC | PRN

## 2022-02-12 MED ORDER — LACTATED RINGERS IV SOLN: INTRAVENOUS | Status: DC | PRN

## 2022-02-12 MED ORDER — BUPIVACAINE HCL (PF) 0.5 % IJ SOLN
INTRAMUSCULAR | Status: AC
  Filled 2022-02-12: qty 10

## 2022-02-12 MED ORDER — METOCLOPRAMIDE HCL 5 MG PO TABS: 5.0000 mg | ORAL_TABLET | Freq: Three times a day (TID) | ORAL | Status: DC | PRN

## 2022-02-12 MED ORDER — CEFAZOLIN SODIUM-DEXTROSE 2-4 GM/100ML-% IV SOLN
INTRAVENOUS | Status: AC
  Filled 2022-02-12: qty 100

## 2022-02-12 MED ORDER — ONDANSETRON HCL 4 MG PO TABS: 4.0000 mg | ORAL_TABLET | Freq: Four times a day (QID) | ORAL | Status: DC | PRN

## 2022-02-12 MED ORDER — MUPIROCIN 2 % EX OINT
1.0000 | TOPICAL_OINTMENT | Freq: Two times a day (BID) | CUTANEOUS | Status: DC
  Administered 2022-02-12 – 2022-02-13 (×3): 1 via NASAL
  Filled 2022-02-12 (×2): qty 22

## 2022-02-12 MED ORDER — BUPIVACAINE HCL (PF) 0.5 % IJ SOLN
INTRAMUSCULAR | Status: DC | PRN
  Administered 2022-02-12: 2.6 mL

## 2022-02-12 MED ORDER — MORPHINE SULFATE (PF) 2 MG/ML IV SOLN
1.0000 mg | INTRAVENOUS | Status: DC | PRN
  Administered 2022-02-13 – 2022-02-14 (×2): 2 mg via INTRAVENOUS
  Filled 2022-02-12 (×2): qty 1

## 2022-02-12 MED ORDER — CEFAZOLIN SODIUM-DEXTROSE 2-4 GM/100ML-% IV SOLN: 2.0000 g | Freq: Four times a day (QID) | INTRAVENOUS | Status: DC

## 2022-02-12 MED ORDER — PROPOFOL 10 MG/ML IV BOLUS
INTRAVENOUS | Status: DC | PRN
  Administered 2022-02-12 (×2): 50 mg via INTRAVENOUS
  Administered 2022-02-12: 30 mg via INTRAVENOUS

## 2022-02-12 MED ORDER — FLEET ENEMA 7-19 GM/118ML RE ENEM: 1.0000 | ENEMA | Freq: Once | RECTAL | Status: DC | PRN

## 2022-02-12 MED ORDER — KETOROLAC TROMETHAMINE 15 MG/ML IJ SOLN: 15.0000 mg | Freq: Once | INTRAMUSCULAR | Status: DC

## 2022-02-12 MED ORDER — ONDANSETRON HCL 4 MG/2ML IJ SOLN: 4.0000 mg | Freq: Four times a day (QID) | INTRAMUSCULAR | Status: DC | PRN

## 2022-02-12 MED ORDER — OXYCODONE HCL 5 MG/5ML PO SOLN: 5.0000 mg | Freq: Once | ORAL | Status: DC | PRN

## 2022-02-12 MED ORDER — KETOROLAC TROMETHAMINE 15 MG/ML IJ SOLN
7.5000 mg | Freq: Four times a day (QID) | INTRAMUSCULAR | Status: AC
  Administered 2022-02-12 – 2022-02-13 (×4): 7.5 mg via INTRAVENOUS
  Filled 2022-02-12 (×4): qty 1

## 2022-02-12 MED ORDER — PHENYLEPHRINE HCL (PRESSORS) 10 MG/ML IV SOLN
INTRAVENOUS | Status: DC | PRN
  Administered 2022-02-12: 160 ug via INTRAVENOUS
  Administered 2022-02-12: 80 ug via INTRAVENOUS
  Administered 2022-02-12 (×3): 160 ug via INTRAVENOUS
  Administered 2022-02-12: 80 ug via INTRAVENOUS
  Administered 2022-02-12: 160 ug via INTRAVENOUS
  Administered 2022-02-12 (×2): 80 ug via INTRAVENOUS

## 2022-02-12 MED ORDER — ACETAMINOPHEN 500 MG PO TABS
1000.0000 mg | ORAL_TABLET | Freq: Four times a day (QID) | ORAL | Status: AC
  Administered 2022-02-12 – 2022-02-13 (×4): 1000 mg via ORAL
  Filled 2022-02-12 (×3): qty 2

## 2022-02-12 MED ORDER — CEFAZOLIN SODIUM-DEXTROSE 2-4 GM/100ML-% IV SOLN
2.0000 g | Freq: Four times a day (QID) | INTRAVENOUS | Status: AC
  Administered 2022-02-12 – 2022-02-13 (×3): 2 g via INTRAVENOUS
  Filled 2022-02-12 (×3): qty 100

## 2022-02-12 MED ORDER — DIPHENHYDRAMINE HCL 12.5 MG/5ML PO ELIX: 12.5000 mg | ORAL_SOLUTION | ORAL | Status: DC | PRN

## 2022-02-12 MED ORDER — ACETAMINOPHEN 325 MG PO TABS: 325.0000 mg | ORAL_TABLET | Freq: Four times a day (QID) | ORAL | Status: DC | PRN

## 2022-02-12 MED ORDER — INSULIN ASPART 100 UNIT/ML IJ SOLN
0.0000 [IU] | Freq: Three times a day (TID) | INTRAMUSCULAR | Status: DC
  Administered 2022-02-13: 3 [IU] via SUBCUTANEOUS
  Administered 2022-02-13: 1 [IU] via SUBCUTANEOUS
  Administered 2022-02-14 (×2): 2 [IU] via SUBCUTANEOUS
  Filled 2022-02-12 (×4): qty 1

## 2022-02-12 MED ORDER — HYDROMORPHONE HCL 1 MG/ML IJ SOLN: 0.2500 mg | INTRAMUSCULAR | Status: DC | PRN

## 2022-02-12 MED ORDER — BUPIVACAINE-EPINEPHRINE (PF) 0.5% -1:200000 IJ SOLN
INTRAMUSCULAR | Status: DC | PRN
  Administered 2022-02-12: 30 mL

## 2022-02-12 MED ORDER — MAGNESIUM HYDROXIDE 400 MG/5ML PO SUSP: 30.0000 mL | Freq: Every day | ORAL | Status: DC | PRN

## 2022-02-12 SURGICAL SUPPLY — 43 items
BNDG COHESIVE 4X5 TAN STRL LF (GAUZE/BANDAGES/DRESSINGS) ×1 IMPLANT
BNDG COHESIVE 6X5 TAN ST LF (GAUZE/BANDAGES/DRESSINGS) ×1 IMPLANT
CHLORAPREP W/TINT 26 (MISCELLANEOUS) ×2 IMPLANT
DRAPE 3/4 80X56 (DRAPES) ×1 IMPLANT
DRAPE C-ARMOR (DRAPES) ×1 IMPLANT
DRSG MEPILEX SACRM 8.7X9.8 (GAUZE/BANDAGES/DRESSINGS) ×1 IMPLANT
DRSG OPSITE POSTOP 3X4 (GAUZE/BANDAGES/DRESSINGS) IMPLANT
DRSG OPSITE POSTOP 4X6 (GAUZE/BANDAGES/DRESSINGS) IMPLANT
DRSG TEGADERM 2X2.25 PEDS (GAUZE/BANDAGES/DRESSINGS) IMPLANT
DRSG TEGADERM 4X4.75 (GAUZE/BANDAGES/DRESSINGS) IMPLANT
DRSG TELFA 3X8 NADH STRL (GAUZE/BANDAGES/DRESSINGS) IMPLANT
ELECT CAUTERY BLADE 6.4 (BLADE) ×1 IMPLANT
ELECT REM PT RETURN 9FT ADLT (ELECTROSURGICAL) ×1
ELECTRODE REM PT RTRN 9FT ADLT (ELECTROSURGICAL) ×1 IMPLANT
GAUZE SPONGE 4X4 12PLY STRL (GAUZE/BANDAGES/DRESSINGS) ×1 IMPLANT
GLOVE BIO SURGEON STRL SZ8 (GLOVE) ×2 IMPLANT
GLOVE SURG UNDER LTX SZ8 (GLOVE) ×1 IMPLANT
GOWN STRL REUS W/ TWL LRG LVL3 (GOWN DISPOSABLE) ×1 IMPLANT
GOWN STRL REUS W/ TWL XL LVL3 (GOWN DISPOSABLE) ×1 IMPLANT
GOWN STRL REUS W/TWL LRG LVL3 (GOWN DISPOSABLE) ×1
GOWN STRL REUS W/TWL XL LVL3 (GOWN DISPOSABLE) ×1
HIP FR NAIL LAG SCREW 10.5X110 (Orthopedic Implant) ×1 IMPLANT
MANIFOLD NEPTUNE II (INSTRUMENTS) ×1 IMPLANT
MAT ABSORB  FLUID 56X50 GRAY (MISCELLANEOUS) ×1
MAT ABSORB FLUID 56X50 GRAY (MISCELLANEOUS) ×1 IMPLANT
NAIL HIP FRAC RT 130 11MX400M (Nail) IMPLANT
NDL FILTER BLUNT 18X1 1/2 (NEEDLE) ×1 IMPLANT
NEEDLE FILTER BLUNT 18X1 1/2 (NEEDLE) ×1 IMPLANT
NEEDLE HYPO 22GX1.5 SAFETY (NEEDLE) ×1 IMPLANT
NS IRRIG 500ML POUR BTL (IV SOLUTION) ×1 IMPLANT
PACK HIP COMPR (MISCELLANEOUS) ×1 IMPLANT
SCREW BONE CORTICAL 5.0X42 (Screw) IMPLANT
SCREW LAG HIP FR NAIL 10.5X110 (Orthopedic Implant) IMPLANT
STAPLER SKIN PROX 35W (STAPLE) ×1 IMPLANT
STRAP SAFETY 5IN WIDE (MISCELLANEOUS) ×1 IMPLANT
SUT VIC AB 0 CT1 36 (SUTURE) ×1 IMPLANT
SUT VIC AB 1 CT1 36 (SUTURE) ×1 IMPLANT
SUT VIC AB 2-0 CT1 (SUTURE) ×2 IMPLANT
SYR 10ML LL (SYRINGE) ×1 IMPLANT
SYR 30ML LL (SYRINGE) ×1 IMPLANT
TAPE MICROFOAM 4IN (TAPE) ×1 IMPLANT
TRAP FLUID SMOKE EVACUATOR (MISCELLANEOUS) ×1 IMPLANT
WATER STERILE IRR 500ML POUR (IV SOLUTION) ×1 IMPLANT

## 2022-02-12 NOTE — Assessment & Plan Note (Signed)
Meets criteria BMI greater than 25 

## 2022-02-12 NOTE — Transfer of Care (Signed)
Immediate Anesthesia Transfer of Care Note  Patient: Terrence Sanchez.  Procedure(s) Performed: INTRAMEDULLARY (IM) NAIL INTERTROCHANTERIC (Right: Leg Upper)  Patient Location: PACU  Anesthesia Type:Spinal  Level of Consciousness: awake and alert   Airway & Oxygen Therapy: Patient Spontanous Breathing  Post-op Assessment: Report given to RN and Post -op Vital signs reviewed and stable  Post vital signs: Reviewed and stable  Last Vitals:  Vitals Value Taken Time  BP 111/77 02/12/22 1522  Temp    Pulse 30 02/12/22 1525  Resp 21 02/12/22 1526  SpO2 63 % 02/12/22 1525  Vitals shown include unvalidated device data.  Last Pain:  Vitals:   02/12/22 0852  TempSrc:   PainSc: 7       Patients Stated Pain Goal: 2 (02/12/22 0217)  Complications: No notable events documented.

## 2022-02-12 NOTE — Anesthesia Procedure Notes (Signed)
Spinal  Patient location during procedure: OR Start time: 02/12/2022 1:12 PM End time: 02/12/2022 1:31 PM Reason for block: surgical anesthesia Staffing Performed: anesthesiologist  Anesthesiologist: Ilene Qua, MD Resident/CRNA: Jonna Clark, CRNA Performed by: Jonna Clark, CRNA Authorized by: Ilene Qua, MD   Preanesthetic Checklist Completed: patient identified, IV checked, site marked, risks and benefits discussed, surgical consent, monitors and equipment checked, pre-op evaluation and timeout performed Spinal Block Patient position: sitting Prep: Betadine Patient monitoring: heart rate, continuous pulse ox, blood pressure and cardiac monitor Approach: midline Location: L2-3 Injection technique: single-shot Needle Needle type: Whitacre and Introducer  Needle gauge: 25 G Needle length: 9 cm Assessment Events: CSF return Additional Notes Negative paresthesia. Negative blood return. Positive free-flowing CSF. Expiration date of kit checked and confirmed. Patient tolerated procedure well, without complications.

## 2022-02-12 NOTE — Plan of Care (Signed)

## 2022-02-12 NOTE — Hospital Course (Signed)
Patient is a 61 year old male with past medical history of diet-controlled diabetes mellitus and multiple failed right ankle surgeries for ankle fracture uses a boot and presented to the emergency room on 11/12 after he tripped and fell 1 week prior landing on his right side.  Since that time, he has been unable to bear weight on his right leg.  In the emergency room, x-rays noted intertrochanteric comminuted fracture of the right proximal femur with varus angulation.  Patient was admitted to the hospitalist service and orthopedic surgery was consulted and took patient on following day for ORIF of the right hip with Biomet nail.

## 2022-02-12 NOTE — Op Note (Signed)
02/12/2022  3:23 PM  Patient:   Terrence Sanchez.  Pre-Op Diagnosis:   Closed displaced three-part intertrochanteric fracture, right hip.  Post-Op Diagnosis:   Same  Procedure:   Reduction and internal fixation of displaced intertrochanteric right hip fracture with Biomet Affixis TFN nail.  Surgeon:   Maryagnes Amos, MD  Assistant:   Deloris Ping, PA-S  Anesthesia:   Spinal  Findings:   As above  Complications:   None  EBL:   50 cc  Fluids:   700 cc crystalloid  UOP:   None  TT:   None  Drains:   None  Closure:   Staples  Implants:   Biomet Affixis 11 x 400 mm TFN with a 110 mm lag screw and a 42 mm distal interlocking screw  Brief Clinical Note:   The patient is a 61 year old male who sustained above-noted injury 1 week ago when he fell in his home. He did not seek treatment immediately, hoping that it would "get better on its own". Because of continued pain, he presented to the emergency room where x-rays demonstrated the above-noted injury. The patient has been cleared medically and presents at this time for reduction and internal fixation of the displaced intertrochanteric right hip fracture.  Procedure:   The patient was brought into the operating room. After adequate spinal anesthesia was obtained, the patient was lain in the supine position on the fracture table. The uninjured leg was placed in a flexed and abducted position while the injured lower extremity was placed in longitudinal traction. The fracture was reduced using longitudinal traction and internal rotation. The adequacy of reduction was verified fluoroscopically in AP and lateral projections and found to be near anatomic. The lateral aspects of the right hip and thigh were prepped with ChloraPrep solution before being draped sterilely. Preoperative antibiotics were administered. A timeout was performed to verify the appropriate surgical site.   The greater trochanter was identified fluoroscopically  and an approximately 5-6 cm incision made about 2-3 fingerbreadths above the tip of the greater trochanter. The incision was carried down through the subcutaneous tissues to expose the gluteal fascia. This was split the length of the incision, providing access to the tip of the trochanter. Under fluoroscopic guidance, a guidewire was drilled through the tip of the trochanter into the proximal metaphysis to the level of the lesser trochanter. After verifying its position fluoroscopically in AP and lateral projections, it was overreamed with the initial reamer to the depth of the lesser trochanter. A guidewire was passed down through the femoral canal to the supracondylar region. The adequacy of guidewire position was verified fluoroscopically in AP and lateral projections before the length of the guidewire within the canal was measured and found to be 420 mm. Therefore, a 400 mm length nail was selected. The guidewire was overreamed sequentially using the flexible reamers, beginning with a 10.5 mm reamer and progressing to a 12.5 mm reamer. This provided good cortical chatter. The 11 x 400 mm Biomet Affixis TFN rod was selected and advanced to the appropriate depth, as verified fluoroscopically.   The guide system for the lag screw was positioned and advanced through an approximately 2 cm stab incision over the lateral aspect of the proximal femur. The guidewire was drilled up through the trochanteric femoral nail and into the femoral neck to rest within 5 mm of subchondral bone. After verifying its position in the femoral neck and head in both AP and lateral projections, the guidewire was measured  and found to be optimally replicated by a 110 mm lag screw. The guidewire was overreamed to the appropriate depth before the lag screw was inserted and advanced to the appropriate depth as verified fluoroscopically in AP and lateral projections. The locking screw was advanced, then backed off a quarter turn to set the  lag screw. Again the adequacy of hardware position and fracture reduction was verified fluoroscopically in AP and lateral projections and found to be excellent.  Attention was directed distally. Using the "perfect circle" technique, the leg and fluoroscopy machine were positioned appropriately. An approximately 1.5 cm stab incision was made over the skin at the appropriate point before the drill bit was advanced through the cortex and across the static hole of the nail. The appropriate length of the screw was determined before the 42 mm distal interlocking screw was positioned, then advanced and tightened securely. Again the adequacy of screw position was verified fluoroscopically in AP and lateral projections and found to be excellent.  The wounds were irrigated thoroughly with sterile saline solution before the abductor fascia was reapproximated using #0 Vicryl interrupted sutures. The subcutaneous tissues were closed using 2-0 Vicryl interrupted sutures. The skin was closed using staples. A total of 30 cc of 0.5% Sensorcaine with epinephrine was injected in and around all incisions. Sterile occlusive dressings were applied to all wounds as well as to the blistered sites before the patient was transferred back to his hospital bed. The patient was then returned to the recovery room in satisfactory condition after tolerating the procedure well.

## 2022-02-12 NOTE — Progress Notes (Signed)
[facility] Progress Note  Patient: Terrence Sanchez.    WUJ:[id number]  DOA: 02/11/2022    Date of Service: the patient was seen and examined on 02/12/2022  Brief hospital course: Patient is a 61 year old male with past medical history of diet-controlled diabetes mellitus and multiple failed right ankle surgeries for ankle fracture uses a boot and presented to the emergency room on 11/12 after he tripped and fell 1 week prior landing on his right side.  Since that time, he has been unable to bear weight on his right leg.  In the emergency room, x-rays noted intertrochanteric comminuted fracture of the right proximal femur with varus angulation.  Patient was admitted to the hospitalist service and orthopedic surgery was consulted and took patient on following day for ORIF of the right hip with Biomet nail.  Assessment and Plan: Assessment and Plan: * Intertrochanteric fracture of right femur, closed, initial encounter (HCC) Status post fall with intertrochanteric, comminuted fracture of the right proximal femur with varus angulation.  Status post ORIF 11/13.  For PT evaluation and likely will need skilled nursing after  Fall Status post fall with intertrochanteric comminuted fracture of the right proximal femur Place patient on fall precautions PT evaluate and treat once appropriate  Controlled type 2 diabetes mellitus without complication, without long-term current use of insulin (HCC) Patient states that he has a history of diabetes mellitus but is not on any medication (diet-controlled).  Will check A1c and for now, sliding scale  Hypokalemia Replace as needed  Transaminitis Patient noted to have transaminitis of unclear etiology.  Unclear etiology.  Mildly elevated with AST of 60 and ALT of 111 on admission.  Right upper quadrant ultrasound unremarkable.  We will recheck labs in the morning.  Overweight (BMI 25.0-29.9) Meets criteria BMI greater than 25       Body  mass index is 27.12 kg/m.  Nutrition Problem: Increased nutrient needs Etiology: post-op healing     Consultants: Orthopedic surgery  Procedures: ORIF 11/13  Antimicrobials: Preop Ancef  Code Status: Full code   Subjective: Patient seen prior to surgery.  Tired, some pain, but not bad as long as he does not move  Objective: Vital signs were reviewed and unremarkable. Vitals:   02/12/22 1640 02/12/22 1719  BP:  129/88  Pulse: 91 86  Resp: 19   Temp:  97.8 F (36.6 C)  SpO2: 99%     Intake/Output Summary (Last 24 hours) at 02/12/2022 1723 Last data filed at 02/12/2022 1645 Gross per 24 hour  Intake 1500 ml  Output 1075 ml  Net 425 ml   Filed Weights   02/11/22 1239  Weight: 90.7 kg   Body mass index is 27.12 kg/m.  Exam:  General: Alert and oriented x3, no acute distress HEENT: Normocephalic, atraumatic, mucous membranes are moist Cardiovascular: Regular rate and rhythm, S1-S2 Respiratory: Clear to auscultation bilaterally Abdomen: Soft, nontender, nondistended, positive bowel sounds Musculoskeletal: No clubbing or cyanosis or edema Skin: No skin breaks, tears or lesions Psychiatry: Appropriate, no evidence of psychoses Neurology: No focal deficits  Data Reviewed: Potassium 3.4  Disposition:  Status is: Inpatient -Evaluation post surgery and skilled nursing likely    Anticipated discharge date: 11/15 \ Family Communication: Will call family DVT Prophylaxis: enoxaparin (LOVENOX) injection 40 mg Start: 02/13/22 0800 SCDs Start: 02/12/22 1659 Place TED hose Start: 02/12/22 1659    Author: Hollice Espy ,MD 02/12/2022 5:23 PM  To reach On-call, see care teams to locate the attending and  reach out via [email or web address]. Between 7PM-7AM, please contact night-coverage If you still have difficulty reaching the attending provider, please page the [facility] (Director on Call) for [facility] on amion for assistance.

## 2022-02-12 NOTE — Progress Notes (Signed)
Initial Nutrition Assessment  DOCUMENTATION CODES:   Not applicable  INTERVENTION:   -Once diet is advanced:  -MVI with minerals daily -Magic cup TID with meals, each supplement provides 290 kcal and 9 grams of protein   NUTRITION DIAGNOSIS:   Increased nutrient needs related to post-op healing as evidenced by estimated needs.  GOAL:   Patient will meet greater than or equal to 90% of their needs  MONITOR:   PO intake, Supplement acceptance, Diet advancement  REASON FOR ASSESSMENT:   Consult Assessment of nutrition requirement/status, Hip fracture protocol  ASSESSMENT:   Pt with medical history significant for diabetes mellitus not on any medications (diet-controlled), history of multiple failed right ankle surgeries for ankle fracture and ambulates with 2 crutches and a boot who presents for evaluation of pain in his right hip following a fall onto his right side about a week ago.  Pt admitted with rt femur fracture s/p fall.   Reviewed I/O's: -245 ml x 24 hours  UOP: 725 ml x 24 hours   Per orthopedics notes, plan for ORIF today. Pt currently NPO for procedure.   Spoke with pt at bedside, who was pleasant and in good spirits today. He acknowledges rationale for NPO order. PTA, pt reports good appetite, usually consuming 2 meals per day. Pt denies any changes to his eating habits or appetite PTA. He often cooks at home (meat, starch, and vegetable), but also enjoys going out to eat with his son.   Pt denies any weight loss. 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 He shares that even when he went to the gym regularly earlier in his life, he was unable to build muscle in his legs despite his efforts.   Discussed importance of good meal and supplement intake to promote healing.   Medications reviewed.   Labs reviewed: Na: 131, K: 3.4, CBGS: 118-148 (inpatient orders for glycemic control are none).    NUTRITION - FOCUSED PHYSICAL EXAM:  Flowsheet Row  Most Recent Value  Orbital Region No depletion  Upper Arm Region No depletion  Thoracic and Lumbar Region No depletion  Buccal Region No depletion  Temple Region No depletion  Clavicle Bone Region No depletion  Clavicle and Acromion Bone Region No depletion  Scapular Bone Region No depletion  Dorsal Hand No depletion  Patellar Region Mild depletion  Anterior Thigh Region Mild depletion  Posterior Calf Region Mild depletion  Edema (RD Assessment) None  Hair Reviewed  Eyes Reviewed  Mouth Reviewed  Skin Reviewed  Nails Reviewed       Diet Order:   Diet Order             Diet NPO time specified  Diet effective midnight                   EDUCATION NEEDS:   Education needs have been addressed  Skin:  Skin Assessment: Skin Integrity Issues: Skin Integrity Issues:: Incisions Incisions: closed leg  Last BM:  Unknown  Height:   Ht Readings from Last 1 Encounters:  02/11/22 6' (1.829 m)    Weight:   Wt Readings from Last 1 Encounters:  02/11/22 90.7 kg    Ideal Body Weight:  80.9 kg  BMI:  Body mass index is 27.12 kg/m.  Estimated Nutritional Needs:   Kcal:  7209-4709  Protein:  115-130 grams  Fluid:  > 2 L    Levada Schilling, RD, LDN, CDCES Registered Dietitian II Certified Diabetes Care and Education  Specialist Please refer to [facility] for RD and/or RD on-call/weekend/after hours pager

## 2022-02-12 NOTE — Assessment & Plan Note (Signed)
Replace as needed °

## 2022-02-12 NOTE — Anesthesia Preprocedure Evaluation (Signed)
Anesthesia Evaluation  Patient identified by MRN, date of birth, ID band Patient awake    Reviewed: Allergy & Precautions, NPO status , Patient's Chart, lab work & pertinent test results  History of Anesthesia Complications Negative for: history of anesthetic complications  Airway Mallampati: II  TM Distance: >3 FB Neck ROM: full    Dental  (+) Chipped, Teeth Intact   Pulmonary neg pulmonary ROS   Pulmonary exam normal        Cardiovascular negative cardio ROS Normal cardiovascular exam     Neuro/Psych negative neurological ROS  negative psych ROS   GI/Hepatic negative GI ROS, Neg liver ROS,,,  Endo/Other  diabetes (diet controlled), Well Controlled, Type 2    Renal/GU      Musculoskeletal   Abdominal   Peds  Hematology negative hematology ROS (+)   Anesthesia Other Findings Past Medical History: No date: Diabetes mellitus without complication (HCC)  Past Surgical History: No date: FRACTURE SURGERY  BMI    Body Mass Index: 27.12 kg/m      Reproductive/Obstetrics negative OB ROS                             Anesthesia Physical Anesthesia Plan  ASA: 2  Anesthesia Plan: Spinal and General/Spinal   Post-op Pain Management:    Induction:   PONV Risk Score and Plan: 1 and Propofol infusion and TIVA  Airway Management Planned: Natural Airway and Nasal Cannula  Additional Equipment:   Intra-op Plan:   Post-operative Plan:   Informed Consent: I have reviewed the patients History and Physical, chart, labs and discussed the procedure including the risks, benefits and alternatives for the proposed anesthesia with the patient or authorized representative who has indicated his/her understanding and acceptance.     Dental Advisory Given  Plan Discussed with: Anesthesiologist, CRNA and Surgeon  Anesthesia Plan Comments: (Patient reports no bleeding problems and no  anticoagulant use.  Plan for spinal with backup GA  Patient consented for risks of anesthesia including but not limited to:  - adverse reactions to medications - damage to eyes, teeth, lips or other oral mucosa - nerve damage due to positioning  - risk of bleeding, infection and or nerve damage from spinal that could lead to paralysis - risk of headache or failed spinal - damage to teeth, lips or other oral mucosa - sore throat or hoarseness - damage to heart, brain, nerves, lungs, other parts of body or loss of life  Patient voiced understanding.)       Anesthesia Quick Evaluation

## 2022-02-13 ENCOUNTER — Encounter: Payer: Self-pay | Admitting: Surgery

## 2022-02-13 DIAGNOSIS — E876 Hypokalemia: Secondary | ICD-10-CM | POA: Diagnosis not present

## 2022-02-13 DIAGNOSIS — R739 Hyperglycemia, unspecified: Secondary | ICD-10-CM | POA: Diagnosis not present

## 2022-02-13 DIAGNOSIS — S72141A Displaced intertrochanteric fracture of right femur, initial encounter for closed fracture: Secondary | ICD-10-CM | POA: Diagnosis not present

## 2022-02-13 DIAGNOSIS — D62 Acute posthemorrhagic anemia: Secondary | ICD-10-CM | POA: Diagnosis not present

## 2022-02-13 LAB — GLUCOSE, CAPILLARY
Glucose-Capillary: 143 mg/dL — ABNORMAL HIGH (ref 70–99)
Glucose-Capillary: 226 mg/dL — ABNORMAL HIGH (ref 70–99)
Glucose-Capillary: 107 mg/dL — ABNORMAL HIGH (ref 70–99)
Glucose-Capillary: 163 mg/dL — ABNORMAL HIGH (ref 70–99)

## 2022-02-13 LAB — CBC
HCT: 27.6 % — ABNORMAL LOW (ref 39.0–52.0)
Hemoglobin: 9.9 g/dL — ABNORMAL LOW (ref 13.0–17.0)
MCH: 33.2 pg (ref 26.0–34.0)
MCHC: 35.9 g/dL (ref 30.0–36.0)
MCV: 92.6 fL (ref 80.0–100.0)
Platelets: 183 10*3/uL (ref 150–400)
RBC: 2.98 MIL/uL — ABNORMAL LOW (ref 4.22–5.81)
RDW: 13 % (ref 11.5–15.5)
WBC: 6.8 10*3/uL (ref 4.0–10.5)
nRBC: 0 % (ref 0.0–0.2)

## 2022-02-13 LAB — BRAIN NATRIURETIC PEPTIDE: B Natriuretic Peptide: 67.1 pg/mL (ref 0.0–100.0)

## 2022-02-13 LAB — COMPREHENSIVE METABOLIC PANEL
ALT: 62 U/L — ABNORMAL HIGH (ref 0–44)
AST: 27 U/L (ref 15–41)
Albumin: 3.1 g/dL — ABNORMAL LOW (ref 3.5–5.0)
Alkaline Phosphatase: 54 U/L (ref 38–126)
Anion gap: 10 (ref 5–15)
BUN: 16 mg/dL (ref 8–23)
CO2: 25 mmol/L (ref 22–32)
Calcium: 8.4 mg/dL — ABNORMAL LOW (ref 8.9–10.3)
Chloride: 96 mmol/L — ABNORMAL LOW (ref 98–111)
Creatinine, Ser: 0.8 mg/dL (ref 0.61–1.24)
GFR, Estimated: >60 mL/min (ref >60)
Glucose, Bld: 153 mg/dL — ABNORMAL HIGH (ref 70–99)
Potassium: 3.3 mmol/L — ABNORMAL LOW (ref 3.5–5.1)
Sodium: 131 mmol/L — ABNORMAL LOW (ref 135–145)
Total Bilirubin: 1.7 mg/dL — ABNORMAL HIGH (ref 0.3–1.2)
Total Protein: 5.7 g/dL — ABNORMAL LOW (ref 6.5–8.1)

## 2022-02-13 MED ORDER — POTASSIUM CHLORIDE CRYS ER 20 MEQ PO TBCR
40.0000 meq | EXTENDED_RELEASE_TABLET | Freq: Once | ORAL | Status: AC
  Administered 2022-02-13: 40 meq via ORAL
  Filled 2022-02-13: qty 2

## 2022-02-13 NOTE — Evaluation (Signed)
Occupational Therapy Evaluation Patient Details Name: Terrence Sanchez. MRN: [id number] DOB: 1960-07-30 Today's Date: 02/13/2022   History of Present Illness Pt is s/p R hip ORIF on 02/12/22 due to fall at home.   Clinical Impression   Pt seen for OT evaluation this date, POD#1 from above surgery. Pt was mod independent in all ADL prior to surgery and using CAM boot and crutches for mobility due to hx of R ankle injuries/impairments. Pt is eager to return to PLOF with less pain and improved safety and independence. Pt currently requires MOD for LB dressing and bathing while in seated position due to pain and limited AROM of R hip. Pt very pain limited throughout session, 10/10, RN notified. Pt required MOD A +2 for safety with standing from recliner with VC for hand placement to improve technique. Pt able to walk from recliner to doorway of room with CGA and ++time/effort to complete 2/2 pain. Pt instructed in self care skills, falls prevention strategies, home/routines modifications, ADL transfers, and pain coping/pain mgt strategies. Pt verbalized understanding. Pt endorses understanding that he is not currently able to safely return home and agreeable to SNF at discharge for additional therapy.      Recommendations for follow up therapy are one component of a multi-disciplinary discharge planning process, led by the attending physician.  Recommendations may be updated based on patient status, additional functional criteria and insurance authorization.   Follow Up Recommendations  Skilled nursing-short term rehab (<3 hours/day)     Assistance Recommended at Discharge Frequent or constant Supervision/Assistance  Patient can return home with the following A lot of help with walking and/or transfers;A lot of help with bathing/dressing/bathroom;Assistance with cooking/housework;Assist for transportation;Help with stairs or ramp for entrance    Functional Status Assessment  Patient has had a  recent decline in their functional status and demonstrates the ability to make significant improvements in function in a reasonable and predictable amount of time.  Equipment Recommendations  BSC/3in1;Other (comment) (2WW, reacher)    Recommendations for Other Services       Precautions / Restrictions Precautions Precautions: Fall Restrictions Weight Bearing Restrictions: Yes RLE Weight Bearing: Weight bearing as tolerated      Mobility Bed Mobility               General bed mobility comments: NT,in recliner at start of session, seated EOB with PT at end of session    Transfers Overall transfer level: Needs assistance Equipment used: Rolling walker (2 wheels) Transfers: Sit to/from Stand Sit to Stand: Mod assist, +2 safety/equipment           General transfer comment: VC for  hand placement      Balance Overall balance assessment: Needs assistance Sitting-balance support: Bilateral upper extremity supported, Feet supported, Single extremity supported Sitting balance-Leahy Scale: Fair     Standing balance support: Bilateral upper extremity supported, Reliant on assistive device for balance Standing balance-Leahy Scale: Fair                             ADL either performed or assessed with clinical judgement   ADL                                         General ADL Comments: Pt requires MOD A For LB ADL tasks, was able to get his CAM  boot on himself but wiht great difficulty and effort. MOD A For ADL transfer     Vision         Perception     Praxis      Pertinent Vitals/Pain Pain Assessment Pain Assessment: 0-10 Pain Score: 10-Worst pain ever Pain Location: R hip and quad Pain Descriptors / Indicators: Grimacing, Guarding, Moaning, Sharp Pain Intervention(s): Limited activity within patient's tolerance, Monitored during session, Repositioned, Ice applied, Patient requesting pain meds-RN notified     Hand Dominance      Extremity/Trunk Assessment Upper Extremity Assessment Upper Extremity Assessment: Overall WFL for tasks assessed   Lower Extremity Assessment RLE Deficits / Details: Chronic R ankle pain from previous surgeries. LImited ankle AROM in all planes. Relies on R CAM boot at baseline   Cervical / Trunk Assessment Cervical / Trunk Assessment: Normal   Communication Communication Communication: No difficulties   Cognition Arousal/Alertness: Awake/alert Behavior During Therapy: WFL for tasks assessed/performed Overall Cognitive Status: Within Functional Limits for tasks assessed                                       General Comments       Exercises Other Exercises Other Exercises: Pt instructed in home/routines modifications, pain mgt strategies, falls prevention, and ADL transfers with RW   Shoulder Instructions      Home Living Family/patient expects to be discharged to:: Private residence Living Arrangements: Alone Available Help at Discharge: Family;Friend(s);Neighbor Type of Home: House Home Access: Stairs to enter [employer] of Steps: 2 Entrance Stairs-Rails: None Home Layout: One level     Bathroom Shower/Tub: Chief Strategy Officer: Standard Bathroom Accessibility: Yes   Home Equipment: Crutches;Other (comment) (CAM boot)          Prior Functioning/Environment Prior Level of Function : Independent/Modified Independent             Mobility Comments: Relies on crutches and CAM boot ADLs Comments: Relies on family and neighbor to drive due to R ankle deficits at baseline        OT Problem List: Decreased strength;Pain;Decreased range of motion;Decreased activity tolerance;Impaired balance (sitting and/or standing);Decreased knowledge of use of DME or AE      OT Treatment/Interventions: Self-care/ADL training;Therapeutic exercise;Therapeutic activities;DME and/or AE instruction;Balance training;Patient/family  education    OT Goals(Current goals can be found in the care plan section) Acute Rehab OT Goals Patient Stated Goal: have less pain OT Goal Formulation: With patient Time For Goal Achievement: 02/27/22 Potential to Achieve Goals: Good ADL Goals Pt Will Perform Lower Body Dressing: with modified independence;sit to/from stand Pt Will Transfer to Toilet: with modified independence (LRAD) Pt Will Perform Toileting - Clothing Manipulation and hygiene: with modified independence Additional ADL Goal #1: Pt will complete all aspects of bathing wiht mod indep, 2/2 opportunities.  OT Frequency: Min 2X/week    Co-evaluation              AM-PAC OT "6 Clicks" Daily Activity     Outcome Measure Help from another person eating meals?: None Help from another person taking care of personal grooming?: A Little Help from another person toileting, which includes using toliet, bedpan, or urinal?: A Lot Help from another person bathing (including washing, rinsing, drying)?: A Lot Help from another person to put on and taking off regular upper body clothing?: A Little Help from another person to put on  and taking off regular lower body clothing?: A Lot 6 Click Score: 16   End of Session Equipment Utilized During Treatment: Gait belt;Rolling walker (2 wheels) Nurse Communication: Mobility status;Patient requests pain meds  Activity Tolerance: Patient limited by pain Patient left: Other (comment) (EOB with PT and RN)  OT Visit Diagnosis: Other abnormalities of gait and mobility (R26.89);History of falling (Z91.81);Muscle weakness (generalized) (M62.81);Pain Pain - Right/Left: Right Pain - part of body: Hip;Knee;Leg                Time: 1354-1420 OT Time Calculation (min): 26 min Charges:  OT General Charges $OT Visit: 1 Visit OT Evaluation $OT Eval Moderate Complexity: 1 Mod OT Treatments $Self Care/Home Management : 8-22 mins  Arman Filter., MPH, MS, OTR/L ascom [phone] 02/13/22, 2:41  PM

## 2022-02-13 NOTE — Progress Notes (Signed)
Talked with the patient about needs and DC Plan, He lives at home alone He stated that he needs to go to [facility] SNF prior to going home I explained that the recommendation is to go home with [facility] He stated that there is no way that he can go up the 2 steps at his house I explained that Ins will not likely cover [facility] SNF if the recommendation is HH, he stated that he plans to go to [facility], he would like a bed search  He will need a rolling walker and 3 in 1 if he does go home

## 2022-02-13 NOTE — Anesthesia Postprocedure Evaluation (Signed)
Anesthesia Post Note  Patient: Terrence Sanchez.  Procedure(s) Performed: INTRAMEDULLARY (IM) NAIL INTERTROCHANTERIC (Right: Leg Upper)  Patient location during evaluation: PACU Anesthesia Type: Spinal and Combined General/Spinal Level of consciousness: awake and alert Pain management: pain level controlled Vital Signs Assessment: post-procedure vital signs reviewed and stable Respiratory status: spontaneous breathing, nonlabored ventilation, respiratory function stable and patient connected to nasal cannula oxygen Cardiovascular status: blood pressure returned to baseline and stable Postop Assessment: no apparent nausea or vomiting Anesthetic complications: no   No notable events documented.   Last Vitals:  Vitals:   02/12/22 2341 02/13/22 0440  BP: (!) 156/78 (!) 141/75  Pulse: 91 95  Resp: 17 17  Temp: 36.7 C 36.6 C  SpO2: 97% 100%    Last Pain:  Vitals:   02/13/22 0320  TempSrc:   PainSc: 10-Worst pain ever                 Louie Boston

## 2022-02-13 NOTE — Progress Notes (Signed)
Physical Therapy Treatment Patient Details Name: Terrence Sanchez. MRN: [id number] DOB: 11/03/1960 Today's Date: 02/13/2022   History of Present Illness Pt is s/p R hip ORIF on 02/12/22 due to fall at home.    PT Comments    Pt received in recliner with OT present endorsing significant pain. Apologetic due to poor tolerance for activity. PT and OT providing encouragement and education on healing process.  Pt requiring modA+2 to stand with VC's for hip positioning and UE use. Once standing, pt ambulates minguard+2 to doorway and back with slowed, R sided antalgic gait. 2 people provided for safety. Min VC's for RW positioning, standing rest breaks for energy conservation and pain management and allow rest to BUE fatigue. Pt with VC's for bed positioning to optimize ease of return to supine. Overall needing minA at RLE. Pt encouraged and further educated on healing process, keeping up to date on pain management to optimize capacity to participate with PT/OT. Rehab team and pt in agreement for d/c to STR due to lack of family support, limited ability to ambulate, and with stairs at home without hand rails as added barrier. Pt lastly educated on not over doing therex and importance of rest for muscle recovery. All needs in reach.    Recommendations for follow up therapy are one component of a multi-disciplinary discharge planning process, led by the attending physician.  Recommendations may be updated based on patient status, additional functional criteria and insurance authorization.  Follow Up Recommendations  Skilled nursing-short term rehab (<3 hours/day) Can patient physically be transported by private vehicle: No   Assistance Recommended at Discharge Frequent or constant Supervision/Assistance  Patient can return home with the following A little help with walking and/or transfers;Assistance with cooking/housework;Assist for transportation;A little help with bathing/dressing/bathroom;Help  with stairs or ramp for entrance   Equipment Recommendations  Rolling walker (2 wheels);BSC/3in1    Recommendations for Other Services       Precautions / Restrictions Precautions Precautions: Fall Restrictions Weight Bearing Restrictions: Yes RLE Weight Bearing: Weight bearing as tolerated     Mobility  Bed Mobility Overal bed mobility: Needs Assistance Bed Mobility: Sit to Supine     Supine to sit: Supervision, HOB elevated Sit to supine: Min assist   General bed mobility comments: minA on RLE to return to supine in bed Patient Response: Cooperative  Transfers Overall transfer level: Needs assistance Equipment used: Rolling walker (2 wheels) Transfers: Sit to/from Stand Sit to Stand: Mod assist, +2 safety/equipment           General transfer comment: VC for  hand placement    Ambulation/Gait Ambulation/Gait assistance: Min guard Gait Distance (Feet): 40 Feet Assistive device: Rolling walker (2 wheels) Gait Pattern/deviations: Step-to pattern, Trunk flexed, Antalgic       General Gait Details: VC's for RW proximity, standing rest breaks for energy conservation due to BUE fatigue..   Stairs             Wheelchair Mobility    Modified Rankin (Stroke Patients Only)       Balance Overall balance assessment: Needs assistance Sitting-balance support: Bilateral upper extremity supported, Feet supported, Single extremity supported Sitting balance-Leahy Scale: Good Sitting balance - Comments: able to lean anteriorly to don/doff cam boot   Standing balance support: Bilateral upper extremity supported, Reliant on assistive device for balance Standing balance-Leahy Scale: Fair  Cognition Arousal/Alertness: Awake/alert Behavior During Therapy: WFL for tasks assessed/performed Overall Cognitive Status: Within Functional Limits for tasks assessed                                           Exercises General Exercises - Lower Extremity Ankle Circles/Pumps: AROM, Strengthening, Both, 10 reps, Supine Quad Sets: AROM, Strengthening, Right, 10 reps, Supine Gluteal Sets: AROM, Strengthening, Both, 10 reps, Supine Short Arc Quad: AROM, Strengthening, 10 reps, Supine, Right Heel Slides: AROM, Strengthening, Right, 10 reps, Supine Hip ABduction/ADduction: AAROM, Strengthening, Right, 10 reps, Supine Other Exercises Other Exercises: Education on frequency of HEP, expectations of healing process/inflammatory process. Education on pain management to improve capacity to participate with therapy.    General Comments        Pertinent Vitals/Pain Pain Assessment Pain Assessment: Faces Faces Pain Scale: Hurts whole lot Pain Location: R hip Pain Descriptors / Indicators: Grimacing, Guarding, Moaning, Sharp Pain Intervention(s): Limited activity within patient's tolerance, Monitored during session, Repositioned, Patient requesting pain meds-RN notified, Ice applied    Home Living Family/patient expects to be discharged to:: Private residence Living Arrangements: Alone Available Help at Discharge: Family;Friend(s);Neighbor Type of Home: House Home Access: Stairs to enter Entrance Stairs-Rails: None Entrance Stairs-Number of Steps: 2   Home Layout: One level Home Equipment: Crutches;Other (comment) (CAM boot)      Prior Function            PT Goals (current goals can now be found in the care plan section) Acute Rehab PT Goals Patient Stated Goal: To go home safely PT Goal Formulation: With patient Time For Goal Achievement: 02/27/22 Potential to Achieve Goals: Good Progress towards PT goals: Not progressing toward goals - comment (pain limiting progression of mobility)    Frequency    7X/week      PT Plan Discharge plan needs to be updated    Co-evaluation              AM-PAC PT "6 Clicks" Mobility   Outcome Measure  Help needed turning from your back  to your side while in a flat bed without using bedrails?: A Little Help needed moving from lying on your back to sitting on the side of a flat bed without using bedrails?: A Little Help needed moving to and from a bed to a chair (including a wheelchair)?: A Little Help needed standing up from a chair using your arms (e.g., wheelchair or bedside chair)?: A Little Help needed to walk in hospital room?: A Lot Help needed climbing 3-5 steps with a railing? : Total 6 Click Score: 15    End of Session Equipment Utilized During Treatment: Gait belt Activity Tolerance: Patient tolerated treatment well;Patient limited by pain Patient left: in bed;with bed alarm set;with call bell/phone within reach Nurse Communication: Mobility status PT Visit Diagnosis: Other abnormalities of gait and mobility (R26.89);Muscle weakness (generalized) (M62.81);Difficulty in walking, not elsewhere classified (R26.2);Pain Pain - Right/Left: Right Pain - part of body: Hip     Time: 1415-1435 PT Time Calculation (min) (ACUTE ONLY): 20 min  Charges:  $Therapeutic Activity: 8-22 mins                    Delphia Grates. Fairly IV, PT, DPT Physical Therapist- [place]  [facility]  02/13/2022, 2:45 PM

## 2022-02-13 NOTE — Progress Notes (Signed)
[facility] Progress Note  Patient: Terrence Sanchez.    UH:[id number]  DOA: 02/11/2022    Date of Service: the patient was seen and examined on 02/13/2022  Brief hospital course: Patient is a 61 year old male with past medical history of diet-controlled diabetes mellitus and multiple failed right ankle surgeries for ankle fracture uses a boot and presented to the emergency room on 11/12 after he tripped and fell 1 week prior landing on his right side.  Since that time, he has been unable to bear weight on his right leg.  In the emergency room, x-rays noted intertrochanteric comminuted fracture of the right proximal femur with varus angulation.  Patient was admitted to the hospitalist service and orthopedic surgery was consulted and took patient on following day for ORIF of the right hip with [employer] nail.  Assessment and Plan: Assessment and Plan: * Intertrochanteric fracture of right femur, closed, initial encounter ([place]) Status post fall with intertrochanteric, comminuted fracture of the right proximal femur with varus angulation.  Status post ORIF 11/13.  PT recommending home health although patient does live alone.  We will continue to monitor.  Fall Status post fall with intertrochanteric comminuted fracture of the right proximal femur Place patient on fall precautions PT evaluate and treat once appropriate  Hyperglycemia Patient states that he has a history of diabetes mellitus but is not on any medication (diet-controlled).  A1c only at 5.3.  At this point, patient does not have formal history of diabetes.  Has had a few elevated blood sugars during hospitalization, but would remove any formal diagnosis of diabetes at this time from his history.  ABLA (acute blood loss anemia) Hemoglobin dropped to 9.9 on postop day 1, down from 12.3.  Continue to monitor.  Transfuse for hemoglobin below 7.0..  Hypokalemia Replace as needed  Transaminitis Patient noted to have mild  transaminitis and mildly elevated bilirubin of unclear etiology on admission.  Right upper quadrant ultrasound unremarkable.  Repeat labs today note improvement although not complete resolution.  Curious, will check BNP to rule out mild hepatic congestion from possibly undiagnosed heart failure?  Overweight (BMI 25.0-29.9) Meets criteria BMI greater than 25       Body mass index is 27.12 kg/m.  Nutrition Problem: Increased nutrient needs Etiology: post-op healing     Consultants: Orthopedic surgery  Procedures: ORIF 11/13  Antimicrobials: Preop Ancef  Code Status: Full code   Subjective: Occasional soreness of right hip  Objective: Vital signs were reviewed and unremarkable. Vitals:   02/13/22 0440 02/13/22 0841  BP: (!) 141/75 (!) 148/88  Pulse: 95 81  Resp: 17 17  Temp: 97.8 F (36.6 C) 98.1 F (36.7 C)  SpO2: 100% 100%    Intake/Output Summary (Last 24 hours) at 02/13/2022 1307 Last data filed at 02/13/2022 1300 Gross per 24 hour  Intake 1020 ml  Output 1200 ml  Net -180 ml    Filed Weights   02/11/22 1239  Weight: 90.7 kg   Body mass index is 27.12 kg/m.  Exam:  General: Alert and oriented x3, no acute distress HEENT: Normocephalic, atraumatic, mucous membranes are moist Cardiovascular: Regular rate and rhythm, S1-S2 Respiratory: Clear to auscultation bilaterally Abdomen: Soft, nontender, nondistended, positive bowel sounds Musculoskeletal: No clubbing or cyanosis or edema Skin: No skin breaks, tears or lesions Psychiatry: Appropriate, no evidence of psychoses Neurology: No focal deficits  Data Reviewed: Potassium 3.3.  Total bilirubin of 1.7.  Hemoglobin of 9.9, down from 12.3  Disposition:  Status is:  Inpatient -Ensure hemoglobin remained stable -Confirming home health his discharge plan    Anticipated discharge date: 11/16 \ Family Communication: Declined for me to call his father DVT Prophylaxis: enoxaparin (LOVENOX) injection  40 mg Start: 02/13/22 0800 SCDs Start: 02/12/22 1659 Place TED hose Start: 02/12/22 1659    Author: Hollice Espy ,MD 02/13/2022 1:07 PM  To reach On-call, see care teams to locate the attending and reach out via [email or web address]. Between 7PM-7AM, please contact night-coverage If you still have difficulty reaching the attending provider, please page the [facility] (Director on Call) for [facility] on amion for assistance.

## 2022-02-13 NOTE — Assessment & Plan Note (Signed)
Hemoglobin dropped to 9.9 on postop day 1, down from 12.3.  Continue to monitor.  Transfuse for hemoglobin below 7.0.Marland Kitchen

## 2022-02-13 NOTE — [place] FL2 (Signed)
[place] MEDICAID FL2 LEVEL OF CARE SCREENING TOOL     IDENTIFICATION  Patient Name: Terrence Sanchez. Birthdate: 1960/06/01 Sex: male Admission Date (Current Location): 02/11/2022  [facility] and [place] Number:  Chiropodist and Address:  [facility], [address], [place], [place] [address]      Provider Number: [id number]  Attending Physician Name and Address:  Hollice Espy, MD  Relative Name and Phone Number:  [name], father [phone]    Current Level of Care: Hospital Recommended Level of Care: Skilled Nursing Facility Prior Approval Number:    Date Approved/Denied:   PASRR Number: [id number]  Discharge Plan: SNF    Current Diagnoses: Patient Active Problem List   Diagnosis Date Noted   ABLA (acute blood loss anemia) 02/13/2022   Overweight (BMI 25.0-29.9) 02/12/2022   Hypokalemia 02/12/2022   Intertrochanteric fracture of right femur, closed, initial encounter ([facility]) 02/11/2022   Fall 02/11/2022   Hyperglycemia 02/11/2022   Transaminitis 02/11/2022    Orientation RESPIRATION BLADDER Height & Weight     Self, Time, Situation, Place  Normal Continent Weight: 90.7 kg Height:  6' (182.9 cm)  BEHAVIORAL SYMPTOMS/MOOD NEUROLOGICAL BOWEL NUTRITION STATUS      Continent Diet (See DC Summary)  AMBULATORY STATUS COMMUNICATION OF NEEDS Skin   Extensive Assist Verbally Normal, Surgical wounds                       Personal Care Assistance Level of Assistance  Bathing, Feeding, Dressing Bathing Assistance: Limited assistance Feeding assistance: Limited assistance Dressing Assistance: Maximum assistance     Functional Limitations Info             SPECIAL CARE FACTORS FREQUENCY  PT (By licensed PT), OT (By licensed OT)     PT Frequency: 5 times per week OT Frequency: 5 times per week            Contractures Contractures Info: Not present    Additional Factors Info  Code Status, Allergies Code  Status Info: full code Allergies Info: NKDA           Current Medications (02/13/2022):  This is the current hospital active medication list Current Facility-Administered Medications  Medication Dose Route Frequency Provider Last Rate Last Admin   0.9 %  sodium chloride infusion   Intravenous Continuous Poggi, Excell Seltzer, MD 75 mL/hr at 02/12/22 1752 New Bag at 02/12/22 1752   acetaminophen (TYLENOL) tablet 325-650 mg  325-650 mg Oral Q6H PRN Poggi, Excell Seltzer, MD       amLODipine (NORVASC) tablet 5 mg  5 mg Oral Daily Poggi, Excell Seltzer, MD   5 mg at 02/13/22 0911   bisacodyl (DULCOLAX) suppository 10 mg  10 mg Rectal Daily PRN Poggi, Excell Seltzer, MD       diphenhydrAMINE (BENADRYL) 12.5 MG/5ML elixir 12.5-25 mg  12.5-25 mg Oral Q4H PRN Poggi, Excell Seltzer, MD       docusate sodium (COLACE) capsule 100 mg  100 mg Oral BID Poggi, Excell Seltzer, MD   100 mg at 02/13/22 0911   enoxaparin (LOVENOX) injection 40 mg  40 mg Subcutaneous Q24H Christena Flake, MD   40 mg at 02/13/22 0727   insulin aspart (novoLOG) injection 0-9 Units  0-9 Units Subcutaneous TID WC Hollice Espy, MD   3 Units at 02/13/22 1211   magnesium hydroxide (MILK OF MAGNESIA) suspension 30 mL  30 mL Oral Daily PRN Poggi, Excell Seltzer, MD  methocarbamol (ROBAXIN) tablet 500 mg  500 mg Oral Q6H PRN Poggi, Excell Seltzer, MD   500 mg at 02/12/22 2117   Or   methocarbamol (ROBAXIN) 500 mg in dextrose 5 % 50 mL IVPB  500 mg Intravenous Q6H PRN Poggi, Excell Seltzer, MD       metoCLOPramide (REGLAN) tablet 5-10 mg  5-10 mg Oral Q8H PRN Poggi, Excell Seltzer, MD       Or   metoCLOPramide (REGLAN) injection 5-10 mg  5-10 mg Intravenous Q8H PRN Poggi, Excell Seltzer, MD       morphine (PF) 2 MG/ML injection 1-2 mg  1-2 mg Intravenous Q2H PRN Poggi, Excell Seltzer, MD       multivitamin with minerals tablet 1 tablet  1 tablet Oral Daily Hollice Espy, MD   1 tablet at 02/13/22 0911   mupirocin ointment (BACTROBAN) 2 % 1 Application  1 Application Nasal BID Poggi, Excell Seltzer, MD   1 Application at  02/13/22 0911   ondansetron (ZOFRAN) tablet 4 mg  4 mg Oral Q6H PRN Poggi, Excell Seltzer, MD       Or   ondansetron (ZOFRAN) injection 4 mg  4 mg Intravenous Q6H PRN Poggi, Excell Seltzer, MD       oxyCODONE (Oxy IR/ROXICODONE) immediate release tablet 5-10 mg  5-10 mg Oral Q4H PRN Poggi, Excell Seltzer, MD   10 mg at 02/13/22 0725   senna (SENOKOT) tablet 8.6 mg  1 tablet Oral BID Christena Flake, MD   8.6 mg at 02/13/22 0911   sodium phosphate (FLEET) 7-19 GM/118ML enema 1 enema  1 enema Rectal Once PRN Poggi, Excell Seltzer, MD         Discharge Medications: Please see discharge summary for a list of discharge medications.  Relevant Imaging Results:  Relevant Lab Results:   Additional Information SS# [id number]  Marlowe Sax, RN

## 2022-02-13 NOTE — Evaluation (Signed)
Physical Therapy Evaluation Patient Details Name: Terrence Sanchez. MRN: [id number] DOB: 11-24-1960 Today's Date: 02/13/2022  History of Present Illness  Pt is s/p R hip ORIF on 02/12/22 due to fall at home.  Clinical Impression  Pt admitted with above diagnosis. Pt received upright in bed agreeable to PT. 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 Relies on transportation from family, neighbors, and friends. Otherwise indep with ADL's/IADL's.   Pt overall slow moving and fearful of mobility but able to participate.  Pt given education and encouragement on healing guidelines, WB precautions, expectations with therapy, reps/sets/frequency with HEP performing exercises below. Pt understanding and able to follow all commands and education with good carryover. Pt performs bed mobility with supervision and increased time and reliant on UE's to mobilize RLE to EOB due to inability to mobilize RLE without external support. In sitting pt able to don RLE CAM boot with set up assist. Pt able to stand minA to RW with cues for hand placement and slowly ambulating with antalgic gait to door and back returning to recliner. Notable L weight shift and heavy UE support needed to offload RLE throughout. Frequent standing rest breaks but safe use of RW with forward progression and turns. Pt returns to recliner and educated on SNF versus HH PT. Pt agreeable for return home with [facility] with understanding of pending safe stair training, indep transfers, and gait progression as pt lives alone and has limited support at home. Pt doffs CAM boot and left in care of RN. Will make pt BID as pt has goals to d/c home and needs additional therapy yo accomplish goals. Pt currently with functional limitations due to the deficits listed below (see PT Problem List). Pt will benefit from skilled PT to increase  their independence and safety with mobility to allow discharge to the venue listed below.     Recommendations for follow up therapy are one component of a multi-disciplinary discharge planning process, led by the attending physician.  Recommendations may be updated based on patient status, additional functional criteria and insurance authorization.  Follow Up Recommendations Home health PT      Assistance Recommended at Discharge Frequent or constant Supervision/Assistance  Patient can return home with the following  A little help with walking and/or transfers;Assistance with cooking/housework;Assist for transportation;A little help with bathing/dressing/bathroom;Help with stairs or ramp for entrance    Equipment Recommendations Rolling walker (2 wheels);BSC/3in1  Recommendations for Other Services       Functional Status Assessment Patient has had a recent decline in their functional status and demonstrates the ability to make significant improvements in function in a reasonable and predictable amount of time.     Precautions / Restrictions Precautions Precautions: Fall Restrictions Weight Bearing Restrictions: Yes RLE Weight Bearing: Weight bearing as tolerated      Mobility  Bed Mobility Overal bed mobility: Needs Assistance Bed Mobility: Supine to Sit     Supine to sit: Supervision, HOB elevated     General bed mobility comments: increased time, needs UE's to mobilize RLE to EOB Patient Response: Cooperative  Transfers Overall transfer level: Needs assistance Equipment used: Rolling walker (2 wheels) Transfers: Sit to/from Stand Sit to Stand: Min assist           General transfer comment: VC's for hand placement.    Ambulation/Gait Ambulation/Gait assistance: Editor, commissioning (  Feet): 42 Feet Assistive device: Rolling walker (2 wheels) Gait Pattern/deviations: Step-to pattern, Trunk flexed, Antalgic       General Gait Details: Slow but  consistent gait cadence that improves as pt ambulates. Safe use of RW but relies heavily on BUE's for support to offload RLE.  Stairs            Wheelchair Mobility    Modified Rankin (Stroke Patients Only)       Balance Overall balance assessment: Needs assistance Sitting-balance support: Bilateral upper extremity supported, Feet supported Sitting balance-Leahy Scale: Good Sitting balance - Comments: able to lean anteriorly to don/doff cam boot   Standing balance support: Bilateral upper extremity supported, Reliant on assistive device for balance Standing balance-Leahy Scale: Fair                               Pertinent Vitals/Pain Pain Assessment Pain Assessment: Faces Faces Pain Scale: Hurts even more Pain Location: R hip and quad Pain Descriptors / Indicators: Discomfort, Grimacing, Guarding Pain Intervention(s): Limited activity within patient's tolerance, Monitored during session, Premedicated before session, Repositioned    Home Living Family/patient expects to be discharged to:: Private residence Living Arrangements: Alone Available Help at Discharge: Family;Friend(s);Neighbor Type of Home: House Home Access: Stairs to enter Entrance Stairs-Rails: None Entrance Stairs-Number of Steps: 2   Home Layout: One level Home Equipment: Crutches;Other (comment) (CAM boot)      Prior Function Prior Level of Function : Independent/Modified Independent             Mobility Comments: Relies on crutches and CAM boot ADLs Comments: Relies on family and neighbor to drive due to R ankle deficits at baseline     Hand Dominance        Extremity/Trunk Assessment   Upper Extremity Assessment Upper Extremity Assessment: Overall WFL for tasks assessed    Lower Extremity Assessment Lower Extremity Assessment: RLE deficits/detail RLE Deficits / Details: Chronic R ankle pain from previous surgeries. LImited ankle AROM in all planes. Relies on R CAM  boot at baseline    Cervical / Trunk Assessment Cervical / Trunk Assessment: Normal  Communication   Communication: No difficulties  Cognition Arousal/Alertness: Awake/alert Behavior During Therapy: WFL for tasks assessed/performed Overall Cognitive Status: Within Functional Limits for tasks assessed                                          General Comments      Exercises General Exercises - Lower Extremity Ankle Circles/Pumps: AROM, Strengthening, Both, 10 reps, Supine Quad Sets: AROM, Strengthening, Right, 10 reps, Supine Gluteal Sets: AROM, Strengthening, Both, 10 reps, Supine Short Arc Quad: AROM, Strengthening, 10 reps, Supine, Right Heel Slides: AROM, Strengthening, Right, 10 reps, Supine Hip ABduction/ADduction: AAROM, Strengthening, Right, 10 reps, Supine Other Exercises Other Exercises: Role of PT in acute setting, d/c recs, WB precautions, safe use of DME, LE therex (reps/sets/frequency)   Assessment/Plan    PT Assessment Patient needs continued PT services  PT Problem List Decreased strength;Decreased range of motion;Decreased activity tolerance;Decreased knowledge of use of DME;Pain;Decreased mobility       PT Treatment Interventions DME instruction;Balance training;Gait training;Neuromuscular re-education;Stair training;Functional mobility training;Patient/family education;Therapeutic activities;Therapeutic exercise    PT Goals (Current goals can be found in the Care Plan section)  Acute Rehab PT Goals Patient Stated Goal: To go  home safely PT Goal Formulation: With patient Time For Goal Achievement: 02/27/22 Potential to Achieve Goals: Good    Frequency BID     Co-evaluation               AM-PAC PT "6 Clicks" Mobility  Outcome Measure Help needed turning from your back to your side while in a flat bed without using bedrails?: A Little Help needed moving from lying on your back to sitting on the side of a flat bed without using  bedrails?: A Little Help needed moving to and from a bed to a chair (including a wheelchair)?: A Little Help needed standing up from a chair using your arms (e.g., wheelchair or bedside chair)?: A Little Help needed to walk in hospital room?: A Lot Help needed climbing 3-5 steps with a railing? : A Lot 6 Click Score: 16    End of Session Equipment Utilized During Treatment: Gait belt Activity Tolerance: Patient tolerated treatment well;Patient limited by pain Patient left: in chair;with call bell/phone within reach;with chair alarm set Nurse Communication: Mobility status PT Visit Diagnosis: Other abnormalities of gait and mobility (R26.89);Muscle weakness (generalized) (M62.81);Difficulty in walking, not elsewhere classified (R26.2);Pain Pain - Right/Left: Right Pain - part of body: Hip    Time: 9480-1655 PT Time Calculation (min) (ACUTE ONLY): 52 min   Charges:   PT Evaluation $PT Eval Low Complexity: 1 Low PT Treatments $Therapeutic Exercise: 23-37 mins       Hartley Urton M. Fairly IV, PT, DPT Physical Therapist- [facility]  02/13/2022, 10:45 AM

## 2022-02-14 DIAGNOSIS — S72141A Displaced intertrochanteric fracture of right femur, initial encounter for closed fracture: Secondary | ICD-10-CM | POA: Diagnosis not present

## 2022-02-14 LAB — CBC
HCT: 27.7 % — ABNORMAL LOW (ref 39.0–52.0)
Hemoglobin: 9.9 g/dL — ABNORMAL LOW (ref 13.0–17.0)
MCH: 33.4 pg (ref 26.0–34.0)
MCHC: 35.7 g/dL (ref 30.0–36.0)
MCV: 93.6 fL (ref 80.0–100.0)
Platelets: 197 10*3/uL (ref 150–400)
RBC: 2.96 MIL/uL — ABNORMAL LOW (ref 4.22–5.81)
RDW: 13.1 % (ref 11.5–15.5)
WBC: 7.6 10*3/uL (ref 4.0–10.5)
nRBC: 0 % (ref 0.0–0.2)

## 2022-02-14 LAB — COMPREHENSIVE METABOLIC PANEL
ALT: 42 U/L (ref 0–44)
AST: 22 U/L (ref 15–41)
Albumin: 3.2 g/dL — ABNORMAL LOW (ref 3.5–5.0)
Alkaline Phosphatase: 58 U/L (ref 38–126)
Anion gap: 11 (ref 5–15)
BUN: 10 mg/dL (ref 8–23)
CO2: 25 mmol/L (ref 22–32)
Calcium: 8.6 mg/dL — ABNORMAL LOW (ref 8.9–10.3)
Chloride: 98 mmol/L (ref 98–111)
Creatinine, Ser: 0.76 mg/dL (ref 0.61–1.24)
GFR, Estimated: >60 mL/min (ref >60)
Glucose, Bld: 165 mg/dL — ABNORMAL HIGH (ref 70–99)
Potassium: 3.6 mmol/L (ref 3.5–5.1)
Sodium: 134 mmol/L — ABNORMAL LOW (ref 135–145)
Total Bilirubin: 1.8 mg/dL — ABNORMAL HIGH (ref 0.3–1.2)
Total Protein: 5.9 g/dL — ABNORMAL LOW (ref 6.5–8.1)

## 2022-02-14 LAB — GLUCOSE, CAPILLARY
Glucose-Capillary: 146 mg/dL — ABNORMAL HIGH (ref 70–99)
Glucose-Capillary: 151 mg/dL — ABNORMAL HIGH (ref 70–99)
Glucose-Capillary: 166 mg/dL — ABNORMAL HIGH (ref 70–99)

## 2022-02-14 MED ORDER — AMLODIPINE BESYLATE 5 MG PO TABS: 5.0000 mg | ORAL_TABLET | Freq: Every day | ORAL | 0 refills | Status: DC

## 2022-02-14 MED ORDER — MORPHINE SULFATE (PF) 2 MG/ML IV SOLN
1.0000 mg | INTRAVENOUS | Status: DC | PRN
  Administered 2022-02-14: 2 mg via INTRAVENOUS
  Filled 2022-02-14: qty 1

## 2022-02-14 MED ORDER — OXYCODONE HCL 5 MG PO TABS: 5.0000 mg | ORAL_TABLET | ORAL | 0 refills | Status: DC | PRN

## 2022-02-14 MED ORDER — ENOXAPARIN SODIUM 40 MG/0.4ML IJ SOSY: 40.0000 mg | PREFILLED_SYRINGE | INTRAMUSCULAR | 0 refills | Status: DC

## 2022-02-14 MED ORDER — METHOCARBAMOL 500 MG PO TABS: 500.0000 mg | ORAL_TABLET | Freq: Four times a day (QID) | ORAL | 0 refills | Status: DC | PRN

## 2022-02-14 MED ORDER — BISACODYL 10 MG RE SUPP: 10.0000 mg | Freq: Every day | RECTAL | 0 refills | Status: AC | PRN

## 2022-02-14 MED ORDER — ADULT MULTIVITAMIN W/MINERALS CH: 1.0000 | ORAL_TABLET | Freq: Every day | ORAL | 0 refills | Status: AC

## 2022-02-14 MED ORDER — DOCUSATE SODIUM 100 MG PO CAPS: 100.0000 mg | ORAL_CAPSULE | Freq: Two times a day (BID) | ORAL | 0 refills | Status: AC

## 2022-02-14 NOTE — Plan of Care (Signed)

## 2022-02-14 NOTE — Progress Notes (Signed)
Physical Therapy Treatment Patient Details Name: Terrence Sanchez. MRN: [id number] DOB: 01-14-1961 Today's Date: 02/14/2022   History of Present Illness Pt is s/p R hip ORIF on 02/12/22 due to fall at home.    PT Comments    Pt was long sitting in bed upon arriving. " Is it suppose to hurt this bad?" Pt was pre-medicated prior to session and agreeable to OOB activity. Rated pain 8/10. Pain limits session progression more so than strength or physical limitations. He was able to exit L side of bed, stand, and ambulate short distance prior to returning to sitting in recliner. Reviewed importance of HEP exercises and continued icing for inflammation. Acute PT will continue to follow and progress as able per current POC.    Recommendations for follow up therapy are one component of a multi-disciplinary discharge planning process, led by the attending physician.  Recommendations may be updated based on patient status, additional functional criteria and insurance authorization.  Follow Up Recommendations  Skilled nursing-short term rehab (<3 hours/day)     Assistance Recommended at Discharge Frequent or constant Supervision/Assistance  Patient can return home with the following A little help with walking and/or transfers;Assistance with cooking/housework;Assist for transportation;A little help with bathing/dressing/bathroom;Help with stairs or ramp for entrance   Equipment Recommendations  Rolling walker (2 wheels);BSC/3in1       Precautions / Restrictions Precautions Precautions: Fall Restrictions Weight Bearing Restrictions: Yes RLE Weight Bearing: Weight bearing as tolerated     Mobility  Bed Mobility Overal bed mobility: Needs Assistance Bed Mobility: Supine to Sit  Supine to sit: Min assist, HOB elevated   Transfers Overall transfer level: Needs assistance Equipment used: Rolling walker (2 wheels) Transfers: Sit to/from Stand Sit to Stand: From elevated surface, Min  assist, Mod assist   Ambulation/Gait Ambulation/Gait assistance: Min guard Gait Distance (Feet): 50 Feet Assistive device: Rolling walker (2 wheels) Gait Pattern/deviations: Step-to pattern, Trunk flexed, Antalgic Gait velocity: decreased     General Gait Details: no LOB or safety concerns however limited by pain    Balance Overall balance assessment: Needs assistance Sitting-balance support: Bilateral upper extremity supported, Feet supported, Single extremity supported Sitting balance-Leahy Scale: Good     Standing balance support: Bilateral upper extremity supported, Reliant on assistive device for balance, During functional activity Standing balance-Leahy Scale: Fair       Cognition Arousal/Alertness: Awake/alert Behavior During Therapy: WFL for tasks assessed/performed Overall Cognitive Status: Within Functional Limits for tasks assessed      General Comments: Pt is A and O x 4           General Comments General comments (skin integrity, edema, etc.): reviewed HEP and importance of performance. Applied ice to affected area to reduce inflammation      Pertinent Vitals/Pain Pain Assessment Pain Assessment: 0-10 Pain Score: 8  Faces Pain Scale: Hurts whole lot Pain Location: R hip Pain Descriptors / Indicators: Grimacing, Guarding, Moaning, Sharp Pain Intervention(s): Limited activity within patient's tolerance, Monitored during session, Premedicated before session, Repositioned, Ice applied     PT Goals (current goals can now be found in the care plan section) Acute Rehab PT Goals Patient Stated Goal: rehab then home Progress towards PT goals: Progressing toward goals    Frequency    7X/week      PT Plan Current plan remains appropriate       AM-PAC PT "6 Clicks" Mobility   Outcome Measure  Help needed turning from your back to your side while in a  flat bed without using bedrails?: A Little Help needed moving from lying on your back to sitting on  the side of a flat bed without using bedrails?: A Little Help needed moving to and from a bed to a chair (including a wheelchair)?: A Little Help needed standing up from a chair using your arms (e.g., wheelchair or bedside chair)?: A Little Help needed to walk in hospital room?: A Little Help needed climbing 3-5 steps with a railing? : Total 6 Click Score: 16    End of Session Equipment Utilized During Treatment: Gait belt Activity Tolerance: Patient tolerated treatment well;Patient limited by pain Patient left: in chair;with call bell/phone within reach;with chair alarm set Nurse Communication: Mobility status PT Visit Diagnosis: Other abnormalities of gait and mobility (R26.89);Muscle weakness (generalized) (M62.81);Difficulty in walking, not elsewhere classified (R26.2);Pain Pain - Right/Left: Right Pain - part of body: Hip     Time: 1025-1050 PT Time Calculation (min) (ACUTE ONLY): 25 min  Charges:  $Gait Training: 8-22 mins $Therapeutic Exercise: 8-22 mins                     Jetta Lout PTA 02/14/22, 12:37 PM

## 2022-02-14 NOTE — Progress Notes (Signed)
Patient is alert and oriented x4. Had serous drainage coming from middle thigh wound that I changed. Complained of pain and received meds for pain. Encouraged patient to move more but patient stated that he is in too much pain. Educated on how mobility can improve outcome. Even after pain meds, patient still denied mobility. Not being mobile coupled with pain med may be the delay in having bowel movement. Had a stool softener last night.

## 2022-02-14 NOTE — Discharge Instructions (Signed)

## 2022-02-14 NOTE — Plan of Care (Signed)
Problem: Education: Goal: Knowledge of General Education information will improve Description: Including pain rating scale, medication(s)/side effects and non-pharmacologic comfort measures 02/14/2022 1508 by Orma Render, RN Outcome: Adequate for Discharge 02/14/2022 0747 by Orma Render, RN Outcome: Progressing   Problem: Health Behavior/Discharge Planning: Goal: Ability to manage health-related needs will improve 02/14/2022 1508 by Orma Render, RN Outcome: Adequate for Discharge 02/14/2022 0747 by Orma Render, RN Outcome: Progressing   Problem: Clinical Measurements: Goal: Ability to maintain clinical measurements within normal limits will improve 02/14/2022 1508 by Orma Render, RN Outcome: Adequate for Discharge 02/14/2022 0747 by Orma Render, RN Outcome: Progressing Goal: Will remain free from infection 02/14/2022 1508 by Orma Render, RN Outcome: Adequate for Discharge 02/14/2022 0747 by Orma Render, RN Outcome: Progressing Goal: Diagnostic test results will improve 02/14/2022 1508 by Orma Render, RN Outcome: Adequate for Discharge 02/14/2022 0747 by Orma Render, RN Outcome: Progressing Goal: Respiratory complications will improve 02/14/2022 1508 by Orma Render, RN Outcome: Adequate for Discharge 02/14/2022 0747 by Orma Render, RN Outcome: Progressing Goal: Cardiovascular complication will be avoided 02/14/2022 1508 by Orma Render, RN Outcome: Adequate for Discharge 02/14/2022 0747 by Orma Render, RN Outcome: Progressing   Problem: Activity: Goal: Risk for activity intolerance will decrease 02/14/2022 1508 by Orma Render, RN Outcome: Adequate for Discharge 02/14/2022 0747 by Orma Render, RN Outcome: Progressing   Problem: Nutrition: Goal: Adequate nutrition will be maintained 02/14/2022 1508 by Orma Render, RN Outcome: Adequate for Discharge 02/14/2022 0747 by Orma Render, RN Outcome: Progressing   Problem:  Coping: Goal: Level of anxiety will decrease 02/14/2022 1508 by Orma Render, RN Outcome: Adequate for Discharge 02/14/2022 0747 by Orma Render, RN Outcome: Progressing   Problem: Elimination: Goal: Will not experience complications related to bowel motility 02/14/2022 1508 by Orma Render, RN Outcome: Adequate for Discharge 02/14/2022 0747 by Orma Render, RN Outcome: Progressing Goal: Will not experience complications related to urinary retention 02/14/2022 1508 by Orma Render, RN Outcome: Adequate for Discharge 02/14/2022 0747 by Orma Render, RN Outcome: Progressing   Problem: Pain Managment: Goal: General experience of comfort will improve 02/14/2022 1508 by Orma Render, RN Outcome: Adequate for Discharge 02/14/2022 0747 by Orma Render, RN Outcome: Progressing   Problem: Safety: Goal: Ability to remain free from injury will improve 02/14/2022 1508 by Orma Render, RN Outcome: Adequate for Discharge 02/14/2022 0747 by Orma Render, RN Outcome: Progressing   Problem: Skin Integrity: Goal: Risk for impaired skin integrity will decrease 02/14/2022 1508 by Orma Render, RN Outcome: Adequate for Discharge 02/14/2022 0747 by Orma Render, RN Outcome: Progressing   Problem: Education: Goal: Ability to describe self-care measures that may prevent or decrease complications (Diabetes Survival Skills Education) will improve 02/14/2022 1508 by Orma Render, RN Outcome: Adequate for Discharge 02/14/2022 0747 by Orma Render, RN Outcome: Progressing Goal: Individualized Educational Video(s) 02/14/2022 1508 by Orma Render, RN Outcome: Adequate for Discharge 02/14/2022 0747 by Orma Render, RN Outcome: Progressing   Problem: Coping: Goal: Ability to adjust to condition or change in health will improve 02/14/2022 1508 by Orma Render, RN Outcome: Adequate for Discharge 02/14/2022 0747 by Orma Render, RN Outcome: Progressing   Problem: Fluid  Volume: Goal: Ability to maintain a balanced intake and output will improve 02/14/2022 1508 by Orma Render, RN Outcome: Adequate for Discharge 02/14/2022 0747 by Richardean Chimera,  Sharlett Iles, RN Outcome: Progressing   Problem: Health Behavior/Discharge Planning: Goal: Ability to identify and utilize available resources and services will improve 02/14/2022 1508 by Orma Render, RN Outcome: Adequate for Discharge 02/14/2022 0747 by Orma Render, RN Outcome: Progressing Goal: Ability to manage health-related needs will improve 02/14/2022 1508 by Orma Render, RN Outcome: Adequate for Discharge 02/14/2022 0747 by Orma Render, RN Outcome: Progressing   Problem: Metabolic: Goal: Ability to maintain appropriate glucose levels will improve 02/14/2022 1508 by Orma Render, RN Outcome: Adequate for Discharge 02/14/2022 0747 by Orma Render, RN Outcome: Progressing   Problem: Nutritional: Goal: Maintenance of adequate nutrition will improve 02/14/2022 1508 by Orma Render, RN Outcome: Adequate for Discharge 02/14/2022 0747 by Orma Render, RN Outcome: Progressing Goal: Progress toward achieving an optimal weight will improve 02/14/2022 1508 by Orma Render, RN Outcome: Adequate for Discharge 02/14/2022 0747 by Orma Render, RN Outcome: Progressing   Problem: Skin Integrity: Goal: Risk for impaired skin integrity will decrease 02/14/2022 1508 by Orma Render, RN Outcome: Adequate for Discharge 02/14/2022 0747 by Orma Render, RN Outcome: Progressing   Problem: Tissue Perfusion: Goal: Adequacy of tissue perfusion will improve 02/14/2022 1508 by Orma Render, RN Outcome: Adequate for Discharge 02/14/2022 0747 by Orma Render, RN Outcome: Progressing

## 2022-02-14 NOTE — Discharge Summary (Signed)
Physician Discharge Summary   Patient: Terrence PattenRay Michael Shin Jr. MRN: [id number] DOB: 10-05-1960  Admit date:     02/11/2022  Discharge date: 02/14/22  Discharge Physician: Enedina FinnerSona Taiga Lupinacci   PCP: Pcp, No   Recommendations at discharge:     F/u dr Joice Loftspoggi in 2 weeks F/u PCP in 1-2 weeks  Discharge Diagnoses: Principal Problem:   Intertrochanteric fracture of right femur, closed, initial encounter [facility](HCC) Active Problems:   Hyperglycemia   ABLA (acute blood loss anemia)   Hypokalemia   Transaminitis   Overweight (BMI 25.0-29.9)   Hospital Course:  Patient is a 61 year old male with past medical history of diet-controlled diabetes mellitus and multiple failed right ankle surgeries for ankle fracture uses a boot and presented to the emergency room on 11/12 after he tripped and fell 1 week prior landing on his right side.  Since that time, he has been unable to bear weight on his right leg.  In the emergency room, x-rays noted intertrochanteric comminuted fracture of the right proximal femur with varus angulation.  Patient was admitted to the hospitalist service and orthopedic surgery was consulted and took patient on following day for ORIF of the right hip with Biomet nail.   Assessment and Plan:   Intertrochanteric fracture of right femur, closed, initial encounter (HCC) --Status post fall with intertrochanteric, comminuted fracture of the right proximal femur with varus angulation.   -Status post ORIF 11/13.   --PT recommending home health although patient does live alone.   --pt prefers going to Rehab --ok from orthopedics to discharge --pain meds and DVT prophylaxis per ortho   Fall --Status post fall with intertrochanteric comminuted fracture of the right proximal femur   Hyperglycemia --Patient states that he has a history of diabetes mellitus but is not on any medication (diet-controlled).   --A1c only at 5.3.  At this point, patient does not have formal history of diabetes.  Has  had a few elevated blood sugars during hospitalization, but would remove any formal diagnosis of diabetes at this time from his history.   ABLA (acute blood loss anemia) --Hemoglobin dropped to 9.9 on postop day 1, down from 12.3.  Continue to monitor.  Transfuse for hemoglobin below 7.0. --overall stable   Hypokalemia Replace as needed   Transaminitis Patient noted to have mild transaminitis and mildly elevated bilirubin of unclear etiology on admission.  Right upper quadrant ultrasound unremarkable.   --LFTS remarkable   Overweight (BMI 25.0-29.9) --Meets criteria BMI greater than 25          Consultants: Dr Joice Loftspoggi Procedures performed: Hip surgery  Disposition: Rehabilitation facility Diet recommendation:  Discharge Diet Orders (From admission, onward)     Start     Ordered   02/14/22 0000  Diet - low sodium heart healthy        02/14/22 0909           Cardiac diet DISCHARGE MEDICATION: Allergies as of 02/14/2022   No Known Allergies      Medication List     TAKE these medications    amLODipine 5 MG tablet Commonly known as: NORVASC Take 1 tablet (5 mg total) by mouth daily. Start taking on: February 15, 2022   bisacodyl 10 MG suppository Commonly known as: DULCOLAX Place 1 suppository (10 mg total) rectally daily as needed for moderate constipation.   docusate sodium 100 MG capsule Commonly known as: COLACE Take 1 capsule (100 mg total) by mouth 2 (two) times daily.   enoxaparin 40 MG/0.4ML  injection Commonly known as: LOVENOX Inject 0.4 mLs (40 mg total) into the skin daily. Start taking on: February 15, 2022   methocarbamol 500 MG tablet Commonly known as: ROBAXIN Take 1 tablet (500 mg total) by mouth every 6 (six) hours as needed for muscle spasms.   multivitamin with minerals Tabs tablet Take 1 tablet by mouth daily. Start taking on: February 15, 2022   oxyCODONE 5 MG immediate release tablet Commonly known as: Oxy IR/ROXICODONE Take  1-2 tablets (5-10 mg total) by mouth every 4 (four) hours as needed for moderate pain (pain score 4-6).               Discharge Care Instructions  (From admission, onward)           Start     Ordered   02/14/22 0000  Discharge wound care:       Comments: 02/12/22 1659    Reinforce dressing  Until discontinued       02/12/22 1658   02/14/22 8250            Contact information for follow-up providers     Anson [place], PA-C Follow up in 14 day(s).   Specialty: Physician Assistant Why: Wenda Low removal. Contact information: [address] [place] [address] [phone]              Contact information for after-discharge care     Destination     [facility] Preferred SNF .   Service: Skilled Nursing Contact information: [address] [place] [address] [phone]                    Discharge Exam: Ceasar Mons Weights   02/11/22 1239  Weight: 90.7 kg     Condition at discharge: fair  The results of significant diagnostics from this hospitalization (including imaging, microbiology, ancillary and laboratory) are listed below for reference.   Imaging Studies: DG Knee Left Port  Result Date: 02/12/2022 CLINICAL DATA:  Left knee pain. EXAM: PORTABLE LEFT KNEE - 1-2 VIEW COMPARISON:  None Available. FINDINGS: Minimal medial compartment joint space narrowing. Moderate superior and mild inferior patellar degenerative osteophytosis with at least mild patellofemoral joint space narrowing. Moderate chronic elongation of the inferior pole of the patella, possibly from chronic traction related changes. Minimal chronic enthesopathic change at the quadriceps insertion on the patella. Tiny joint effusion. No acute fracture or dislocation. IMPRESSION: 1. 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 2. Moderate chronic traction related changes at the lower pole of the patella. 3. Tiny  joint effusion. Electronically Signed   By: Neita Garnet M.D.   On: 02/12/2022 16:05   DG HIP UNILAT WITH PELVIS 2-3 VIEWS RIGHT  Result Date: 02/12/2022 CLINICAL DATA:  Elective surgery. Right intramedullary intertrochanteric nail EXAM: DG HIP (WITH OR WITHOUT PELVIS) 2-3V RIGHT COMPARISON:  Pelvis and right hip radiographs 02/11/2022 FINDINGS: Images were performed intraoperatively without the presence of a radiologist. Interval long-stem cephalomedullary nail fixation of the previously seen comminuted intertrochanteric proximal femoral fracture. No hardware complication is seen. Total fluoroscopy images: 6 Please see intraoperative findings for further detail. IMPRESSION: Interval long-stem cephalomedullary nail fixation of the previously seen comminuted intertrochanteric proximal femoral fracture. Electronically Signed   By: Neita Garnet M.D.   On: 02/12/2022 15:30   DG C-Arm 1-60 Min-No Report  Result Date: 02/12/2022 Fluoroscopy was utilized by the requesting physician.  No radiographic interpretation.   DG C-Arm 1-60 Min-No Report  Result Date: 02/12/2022  Fluoroscopy was utilized by the requesting physician.  No radiographic interpretation.   US Abdomen Limited RUQ (LIVER/GB)  Result Date: 02/11/2022 CLINICAL DATA:  Transaminitis EXAM: ULTRASOUND ABDOMEN LIMITED RIGHT UPPER QUADRANT COMPARISON:  None Available. FINDINGS: Gallbladder: No gallstones or wall thickening visualized. No sonographic Murphy sign noted by sonographer. Common bile duct: Diameter: 4 mm Liver: No focal lesion identified. Within normal limits in parenchymal echogenicity. Portal vein is patent on color Doppler imaging with normal direction of blood flow towards the liver. Other: None. IMPRESSION: No acute intra-abdominal abnormalities. No etiology for transaminitis identified. Electronically Signed   By: Jacob Moores M.D.   On: 02/11/2022 16:38   DG Knee Complete 4 Views Right  Result Date:  02/11/2022 CLINICAL DATA:  Fall 1 week ago.  Right hip and femur region pain. EXAM: RIGHT KNEE - COMPLETE 4+ VIEW COMPARISON:  None Available. FINDINGS: No fracture.  No bone lesion. Knee joint normally spaced and aligned. No significant degenerative/arthropathic changes. No joint effusion. Soft tissues are unremarkable. IMPRESSION: Negative. Electronically Signed   By: Amie Portland M.D.   On: 02/11/2022 13:41   DG Chest Portable 1 View  Result Date: 02/11/2022 CLINICAL DATA:  Fall 1 week ago.  Right hip fracture. EXAM: PORTABLE CHEST 1 VIEW COMPARISON:  02/25/2006. FINDINGS: Cardiac silhouette is normal in size. Normal mediastinal and hilar contours. Clear lungs.  No pleural effusion or pneumothorax. Skeletal structures are grossly intact. IMPRESSION: No active disease. Electronically Signed   By: Amie Portland M.D.   On: 02/11/2022 13:40   DG Hip Unilat W or Wo Pelvis 2-3 Views Right  Result Date: 02/11/2022 CLINICAL DATA:  Pt reports falling 1 week ago, denies striking head. Since fall patient has not been able to ambulate around his house and has been confined to his couch. Pt reports right hip pain, right femur pain. EXAM: DG HIP (WITH OR WITHOUT PELVIS) 2-3V RIGHT COMPARISON:  None Available. FINDINGS: Comminuted intertrochanteric fracture of the proximal right femur. Primary fracture components are mildly displaced, by approximately 1.5 cm. There is varus and apex posterior angulation. No other fractures. No bone lesions. Hip joints, SI joints and pubic symphysis are normally aligned. Soft tissues are unremarkable. IMPRESSION: 1. Intertrochanteric, comminuted fracture of the right proximal femur with varus angulation. No dislocation. Electronically Signed   By: Amie Portland M.D.   On: 02/11/2022 13:40    Microbiology: Results for orders placed or performed during the hospital encounter of 02/11/22  Surgical PCR screen     Status: None   Collection Time: 02/12/22  6:53 AM   Specimen: Nasal  Mucosa; Nasal Swab  Result Value Ref Range Status   MRSA, PCR NEGATIVE NEGATIVE Final   Staphylococcus aureus NEGATIVE NEGATIVE Final    Comment: (NOTE) The Xpert SA Assay (FDA approved for NASAL specimens in patients 21 years of age and older), is one component of a comprehensive surveillance program. It is not intended to diagnose infection nor to guide or monitor treatment. Performed at [facility], [address] [place], [place] [address]     Labs: CBC: Recent Labs  Lab 02/11/22 1244 02/12/22 0439 02/13/22 0540 02/14/22 0441  WBC 9.3 7.7 6.8 7.6  NEUTROABS 7.2  --   --   --   HGB 13.5 12.3* 9.9* 9.9*  HCT 37.1* 33.8* 27.6* 27.7*  MCV 93.5 92.9 92.6 93.6  PLT 260 223 183 197   Basic Metabolic Panel: Recent Labs  Lab 02/11/22 1244 02/12/22 0439 02/13/22 0540 02/14/22 0441  NA 132* 131* 131* 134*  K 3.7 3.4* 3.3* 3.6  CL 94* 95* 96* 98  CO2 23 25 25 25   GLUCOSE 173* 150* 153* 165*  BUN 18 17 16 10   CREATININE 0.78 0.80 0.80 0.76  CALCIUM 9.6 9.0 8.4* 8.6*   Liver Function Tests: Recent Labs  Lab 02/11/22 1244 02/13/22 0540 02/14/22 0441  AST 60* 27 22  ALT 111* 62* 42  ALKPHOS 73 54 58  BILITOT 2.9* 1.7* 1.8*  PROT 7.4 5.7* 5.9*  ALBUMIN 4.3 3.1* 3.2*   CBG: Recent Labs  Lab 02/13/22 1648 02/13/22 2106 02/14/22 0556 02/14/22 0810 02/14/22 1139  GLUCAP 107* 163* 146* 151* 166*    Discharge time spent: greater than 30 minutes.  Signed: 02/16/22, MD [facility] 02/14/2022

## 2022-02-14 NOTE — Progress Notes (Signed)
  Subjective: 2 Days Post-Op Procedure(s) (LRB): INTRAMEDULLARY (IM) NAIL INTERTROCHANTERIC (Right) Patient reports pain as 8 on 0-10 scale.   Patient is well but does still report moderate to severe pain in the right leg. Plan is to go Skilled nursing facility after hospital stay. Negative for chest pain and shortness of breath Fever: no Gastrointestinal:Negative for nausea and vomiting  Objective: Vital signs in last 24 hours: Temp:  [97.4 F (36.3 C)-97.5 F (36.4 C)] 97.5 F (36.4 C) (11/15 0302) Pulse Rate:  [83-87] 83 (11/15 0302) Resp:  [17-18] 18 (11/15 0302) BP: (130-145)/(69-81) 130/69 (11/15 0302) SpO2:  [96 %-100 %] 96 % (11/15 0302)  Intake/Output from previous day:  Intake/Output Summary (Last 24 hours) at 02/14/2022 1219 Last data filed at 02/14/2022 0536 Gross per 24 hour  Intake 240 ml  Output 2150 ml  Net -1910 ml    Intake/Output this shift: No intake/output data recorded.  Labs: Recent Labs    02/11/22 1244 02/12/22 0439 02/13/22 0540 02/14/22 0441  HGB 13.5 12.3* 9.9* 9.9*   Recent Labs    02/13/22 0540 02/14/22 0441  WBC 6.8 7.6  RBC 2.98* 2.96*  HCT 27.6* 27.7*  PLT 183 197   Recent Labs    02/13/22 0540 02/14/22 0441  NA 131* 134*  K 3.3* 3.6  CL 96* 98  CO2 25 25  BUN 16 10  CREATININE 0.80 0.76  GLUCOSE 153* 165*  CALCIUM 8.4* 8.6*   No results for input(s): "LABPT", "INR" in the last 72 hours.   EXAM General - Patient is Alert, Appropriate, and Oriented Extremity - ABD soft Neurovascular intact Intact pulses distally Dorsiflexion/Plantar flexion intact Incision: moderate drainage No cellulitis present Dressing/Incision - blood tinged drainage noted to the right leg.  Moderate ecchymosis. Motor Function - intact, moving foot and toes well on exam. Does have a history of a ankle fusion, limited dorsiflexion and plantarflexon. Negative homans to bilateral lower extremities. Right thigh is soft to palpation.  Past  Medical History:  Diagnosis Date   Diabetes mellitus without complication (HCC)     Assessment/Plan: 2 Days Post-Op Procedure(s) (LRB): INTRAMEDULLARY (IM) NAIL INTERTROCHANTERIC (Right) Principal Problem:   Intertrochanteric fracture of right femur, closed, initial encounter (HCC) Active Problems:   Hyperglycemia   Transaminitis   Overweight (BMI 25.0-29.9)   Hypokalemia   ABLA (acute blood loss anemia)  Estimated body mass index is 27.12 kg/m as calculated from the following:   Height as of this encounter: 6' (1.829 m).   Weight as of this encounter: 90.7 kg. Advance diet Up with therapy D/C IV fluids when tolerating po intake.  Vitals reviewed this AM. Moderate pain still in the right leg, continue with oxycodone. Continue with PT as tolerated.  Follow-up with [facility] in 10-14 days for staple removal and x-rays of the right femur. Continue with Lovenox 40mg  daily for 14 days for DVT prophylaxis.  DVT Prophylaxis - Lovenox and TED hose Weight-Bearing as tolerated to right leg  J. , PA-C [facility] 02/14/2022, 12:19 PM

## 2022-02-14 NOTE — TOC Progression Note (Signed)
Transition of Care (TOC) - Progression Note    Patient Details  Name: Terrence Sanchez. MRN: [id number] Date of Birth: 09-23-1960  Transition of Care [facility]) CM/SW Contact  Marlowe Sax, RN Phone Number: 02/14/2022, 1:24 PM  Clinical Narrative:    Notified the patient of the room 73P at [facility], He will notify his son and dad, EMS called to transport    Expected Discharge Plan: Home w Home Health Services Barriers to Discharge: Continued Medical Work up  Expected Discharge Plan and Services Expected Discharge Plan: Home w Home Health Services   Discharge Planning Services: CM Consult   Living arrangements for the past 2 months: Single Family Home Expected Discharge Date: 02/14/22               DME Arranged: Dan Humphreys rolling, 3-N-1 DME Agency: [employer]                   Social Determinants of Health (SDOH) Interventions    Readmission Risk Interventions     No data to display

## 2022-02-14 NOTE — Care Management Important Message (Signed)
Important Message  Patient Details  Name: Terrence Sanchez. MRN: [id number] Date of Birth: Jan 09, 1961   Medicare Important Message Given:  N/A - LOS <3 / Initial given by admissions     Olegario Messier A Trelyn Vanderlinde 02/14/2022, 9:19 AM

## 2022-02-14 NOTE — Progress Notes (Signed)
Report called to [facility] at [employer] [phone]).

## 2022-02-14 NOTE — TOC Progression Note (Signed)
Transition of Care (TOC) - Progression Note    Patient Details  Name: Terrence Sanchez. MRN: [id number] Date of Birth: [birth year]  Transition of Care [facility]) CM/SW Contact  Marlowe Sax, RN Phone Number: 02/14/2022, 8:44 AM  Clinical Narrative:   Spoke with the patient and reviewed the bed offers, He chose [facility], I notified Tonya at [facility].Anticipate DC today    Expected Discharge Plan: Home w Home Health Services Barriers to Discharge: Continued Medical Work up  Expected Discharge Plan and Services Expected Discharge Plan: Home w Home Health Services   Discharge Planning Services: CM Consult   Living arrangements for the past 2 months: Single Family Home                 DME Arranged: Walker rolling, 3-N-1 DME Agency: [employer]                   Social Determinants of Health (SDOH) Interventions    Readmission Risk Interventions     No data to display

## 2024-03-13 ENCOUNTER — Observation Stay
Admission: EM | Admit: 2024-03-13 | Discharge: 2024-03-17 | DRG: 065 | Disposition: A | Discharge status: Skilled Nursing Facility | Attending: Hospitalist | Admitting: Hospitalist

## 2024-03-13 ENCOUNTER — Emergency Department

## 2024-03-13 ENCOUNTER — Other Ambulatory Visit: Payer: Self-pay

## 2024-03-13 ENCOUNTER — Observation Stay

## 2024-03-13 DIAGNOSIS — R7401 Elevation of levels of liver transaminase levels: Secondary | ICD-10-CM | POA: Diagnosis present

## 2024-03-13 DIAGNOSIS — M6281 Muscle weakness (generalized): Secondary | ICD-10-CM

## 2024-03-13 DIAGNOSIS — I639 Cerebral infarction, unspecified: Secondary | ICD-10-CM | POA: Diagnosis present

## 2024-03-13 DIAGNOSIS — R739 Hyperglycemia, unspecified: Secondary | ICD-10-CM | POA: Diagnosis present

## 2024-03-13 DIAGNOSIS — R29810 Facial weakness: Principal | ICD-10-CM

## 2024-03-13 LAB — COMPREHENSIVE METABOLIC PANEL WITH GFR
ALT: 63 U/L — ABNORMAL HIGH (ref 0–44)
AST: 60 U/L — ABNORMAL HIGH (ref 15–41)
Albumin: 4.6 g/dL (ref 3.5–5.0)
Alkaline Phosphatase: 75 U/L (ref 38–126)
Anion gap: 20 — ABNORMAL HIGH (ref 5–15)
BUN: 15 mg/dL (ref 8–23)
CO2: 20 mmol/L — ABNORMAL LOW (ref 22–32)
Calcium: 9.4 mg/dL (ref 8.9–10.3)
Chloride: 96 mmol/L — ABNORMAL LOW (ref 98–111)
Creatinine, Ser: 0.95 mg/dL (ref 0.61–1.24)
GFR, Estimated: >60 mL/min (ref >60)
Glucose, Bld: 153 mg/dL — ABNORMAL HIGH (ref 70–99)
Potassium: 4.1 mmol/L (ref 3.5–5.1)
Sodium: 136 mmol/L (ref 135–145)
Total Bilirubin: 1.7 mg/dL — ABNORMAL HIGH (ref 0.0–1.2)
Total Protein: 7.2 g/dL (ref 6.5–8.1)

## 2024-03-13 LAB — CBC
HCT: 43.3 % (ref 39.0–52.0)
Hemoglobin: 15.4 g/dL (ref 13.0–17.0)
MCH: 34 pg (ref 26.0–34.0)
MCHC: 35.6 g/dL (ref 30.0–36.0)
MCV: 95.6 fL (ref 80.0–100.0)
Platelets: 159 K/uL (ref 150–400)
RBC: 4.53 MIL/uL (ref 4.22–5.81)
RDW: 14.5 % (ref 11.5–15.5)
WBC: 7.6 K/uL (ref 4.0–10.5)
nRBC: 0 % (ref 0.0–0.2)

## 2024-03-13 LAB — BLOOD GAS, VENOUS
Acid-base deficit: 1.1 mmol/L (ref 0.0–2.0)
Bicarbonate: 22.4 mmol/L (ref 20.0–28.0)
O2 Saturation: 96.9 %
Patient temperature: 37
pCO2, Ven: 33 mmHg — ABNORMAL LOW (ref 44–60)
pH, Ven: 7.44 — ABNORMAL HIGH (ref 7.25–7.43)
pO2, Ven: 71 mmHg — ABNORMAL HIGH (ref 32–45)

## 2024-03-13 LAB — DIFFERENTIAL
Abs Immature Granulocytes: 0.02 K/uL (ref 0.00–0.07)
Basophils Absolute: 0 K/uL (ref 0.0–0.1)
Basophils Relative: 0 %
Eosinophils Absolute: 0 K/uL (ref 0.0–0.5)
Eosinophils Relative: 0 %
Immature Granulocytes: 0 %
Lymphocytes Relative: 14 %
Lymphs Abs: 1.1 K/uL (ref 0.7–4.0)
Monocytes Absolute: 0.4 K/uL (ref 0.1–1.0)
Monocytes Relative: 6 %
Neutro Abs: 6 K/uL (ref 1.7–7.7)
Neutrophils Relative %: 80 %

## 2024-03-13 LAB — LIPID PANEL
Cholesterol: 149 mg/dL (ref 0–200)
HDL: 51 mg/dL (ref >40)
LDL Cholesterol: 82 mg/dL (ref 0–99)
Total CHOL/HDL Ratio: 2.9 ratio
Triglycerides: 81 mg/dL (ref <150)
VLDL: 16 mg/dL (ref 0–40)

## 2024-03-13 LAB — PROTIME-INR
INR: 1.1 (ref 0.8–1.2)
Prothrombin Time: 14.5 s (ref 11.4–15.2)

## 2024-03-13 LAB — LACTIC ACID, PLASMA: Lactic Acid, Venous: 1 mmol/L (ref 0.5–1.9)

## 2024-03-13 LAB — HEMOGLOBIN A1C
Hgb A1c MFr Bld: 5.3 % (ref 4.8–5.6)
Mean Plasma Glucose: 105.41 mg/dL

## 2024-03-13 LAB — APTT: aPTT: 27 s (ref 24–36)

## 2024-03-13 LAB — MAGNESIUM: Magnesium: 2 mg/dL (ref 1.7–2.4)

## 2024-03-13 LAB — GLUCOSE, CAPILLARY: Glucose-Capillary: 170 mg/dL — ABNORMAL HIGH (ref 70–99)

## 2024-03-13 LAB — CBG MONITORING, ED: Glucose-Capillary: 160 mg/dL — ABNORMAL HIGH (ref 70–99)

## 2024-03-13 MED ORDER — THIAMINE HCL 100 MG/ML IJ SOLN
100.0000 mg | Freq: Every day | INTRAMUSCULAR | Status: DC
  Administered 2024-03-13 – 2024-03-17 (×2): 100 mg via INTRAVENOUS
  Filled 2024-03-13 (×3): qty 2

## 2024-03-13 MED ORDER — ACETAMINOPHEN 325 MG PO TABS
650.0000 mg | ORAL_TABLET | Freq: Four times a day (QID) | ORAL | Status: DC | PRN
  Administered 2024-03-13 – 2024-03-15 (×4): 650 mg via ORAL
  Filled 2024-03-13 (×4): qty 2

## 2024-03-13 MED ORDER — AMLODIPINE BESYLATE 5 MG PO TABS
5.0000 mg | ORAL_TABLET | Freq: Once | ORAL | Status: AC
  Administered 2024-03-13: 5 mg via ORAL
  Filled 2024-03-13: qty 1

## 2024-03-13 MED ORDER — IOHEXOL 350 MG/ML SOLN
75.0000 mL | Freq: Once | INTRAVENOUS | Status: AC | PRN
  Administered 2024-03-13: 75 mL via INTRAVENOUS

## 2024-03-13 MED ORDER — FOLIC ACID 1 MG PO TABS
1.0000 mg | ORAL_TABLET | Freq: Every day | ORAL | Status: DC
  Administered 2024-03-13 – 2024-03-17 (×5): 1 mg via ORAL
  Filled 2024-03-13 (×5): qty 1

## 2024-03-13 MED ORDER — THIAMINE MONONITRATE 100 MG PO TABS
100.0000 mg | ORAL_TABLET | Freq: Every day | ORAL | Status: DC
  Administered 2024-03-14 – 2024-03-16 (×3): 100 mg via ORAL
  Filled 2024-03-13 (×4): qty 1

## 2024-03-13 MED ORDER — LORAZEPAM 1 MG PO TABS: 1.0000 mg | ORAL_TABLET | ORAL | Status: AC | PRN

## 2024-03-13 MED ORDER — ADULT MULTIVITAMIN W/MINERALS CH
1.0000 | ORAL_TABLET | Freq: Every day | ORAL | Status: DC
  Administered 2024-03-13 – 2024-03-17 (×5): 1 via ORAL
  Filled 2024-03-13 (×5): qty 1

## 2024-03-13 MED ORDER — INSULIN ASPART 100 UNIT/ML IJ SOLN
0.0000 [IU] | Freq: Three times a day (TID) | INTRAMUSCULAR | Status: DC
  Administered 2024-03-14 (×2): 1 [IU] via SUBCUTANEOUS
  Administered 2024-03-14 – 2024-03-15 (×2): 2 [IU] via SUBCUTANEOUS
  Administered 2024-03-15 – 2024-03-16 (×3): 1 [IU] via SUBCUTANEOUS
  Administered 2024-03-16: 13:00:00 2 [IU] via SUBCUTANEOUS
  Administered 2024-03-16 – 2024-03-17 (×2): 1 [IU] via SUBCUTANEOUS
  Filled 2024-03-13: qty 3
  Filled 2024-03-13: qty 1
  Filled 2024-03-13: qty 2
  Filled 2024-03-13: qty 1
  Filled 2024-03-13: qty 2
  Filled 2024-03-13 (×4): qty 1

## 2024-03-13 MED ORDER — ONDANSETRON HCL 4 MG PO TABS: 4.0000 mg | ORAL_TABLET | Freq: Four times a day (QID) | ORAL | Status: DC | PRN

## 2024-03-13 MED ORDER — SODIUM CHLORIDE 0.9% FLUSH
3.0000 mL | Freq: Two times a day (BID) | INTRAVENOUS | Status: DC
  Administered 2024-03-13 – 2024-03-17 (×8): 3 mL via INTRAVENOUS

## 2024-03-13 MED ORDER — TRAZODONE HCL 50 MG PO TABS
25.0000 mg | ORAL_TABLET | Freq: Every evening | ORAL | Status: DC | PRN
  Administered 2024-03-13 – 2024-03-16 (×3): 25 mg via ORAL
  Filled 2024-03-13 (×3): qty 1

## 2024-03-13 MED ORDER — ACETAMINOPHEN 650 MG RE SUPP: 650.0000 mg | Freq: Four times a day (QID) | RECTAL | Status: DC | PRN

## 2024-03-13 MED ORDER — HEPARIN SODIUM (PORCINE) 5000 UNIT/ML IJ SOLN
5000.0000 [IU] | Freq: Three times a day (TID) | INTRAMUSCULAR | Status: DC
  Administered 2024-03-13 – 2024-03-17 (×11): 5000 [IU] via SUBCUTANEOUS
  Filled 2024-03-13 (×11): qty 1

## 2024-03-13 MED ORDER — SENNOSIDES-DOCUSATE SODIUM 8.6-50 MG PO TABS: 1.0000 | ORAL_TABLET | Freq: Every evening | ORAL | Status: DC | PRN

## 2024-03-13 MED ORDER — ASPIRIN 81 MG PO CHEW
81.0000 mg | CHEWABLE_TABLET | Freq: Once | ORAL | Status: AC
  Administered 2024-03-13: 81 mg via ORAL
  Filled 2024-03-13: qty 1

## 2024-03-13 MED ORDER — ONDANSETRON HCL 4 MG/2ML IJ SOLN: 4.0000 mg | Freq: Four times a day (QID) | INTRAMUSCULAR | Status: DC | PRN

## 2024-03-13 MED ORDER — LORAZEPAM 2 MG/ML IJ SOLN: 1.0000 mg | INTRAMUSCULAR | Status: AC | PRN

## 2024-03-13 MED ORDER — STROKE: EARLY STAGES OF RECOVERY BOOK: Freq: Once | Status: AC

## 2024-03-13 MED ORDER — CLOPIDOGREL BISULFATE 75 MG PO TABS
300.0000 mg | ORAL_TABLET | Freq: Once | ORAL | Status: AC
  Administered 2024-03-13: 300 mg via ORAL
  Filled 2024-03-13: qty 4

## 2024-03-13 NOTE — ED Provider Notes (Signed)
 [facility] Provider Note   Event Date/Time   First MD Initiated Contact with Patient 03/13/24 1503     (approximate) History  Facial Droop  HPI Terrence Sanchez. is a 63 y.o. male with a past medical history of diabetes who presents complaining of left-sided facial droop, slurred speech, loss of sensation in strength in the left leg and left arm that began on Wednesday at 8 PM.  Patient only came to the emergency department today as his son came to check on him and noticed these deficits.  Patient is not taking any blood thinner medications.  ROS: Patient currently denies any vision changes, tinnitus, sore throat, chest pain, shortness of breath, abdominal pain, nausea/vomiting/diarrhea, dysuria   Physical Exam  Triage Vital Signs: ED Triage Vitals  Encounter Vitals Group     BP 03/13/24 1445 (!) 183/90     Girls Systolic BP Percentile --      Girls Diastolic BP Percentile --      Boys Systolic BP Percentile --      Boys Diastolic BP Percentile --      Pulse Rate 03/13/24 1445 96     Resp 03/13/24 1445 17     Temp 03/13/24 1445 98.5 F (36.9 C)     Temp Source 03/13/24 1445 Oral     SpO2 03/13/24 1445 99 %     Weight 03/13/24 1446 205 lb (93 kg)     Height 03/13/24 1446 6' (1.829 m)     Head Circumference --      Peak Flow --      Pain Score 03/13/24 1446 0     Pain Loc --      Pain Education --      Exclude from Growth Chart --    Most recent vital signs: Vitals:   03/13/24 1900 03/13/24 2009  BP: (!) 157/92 (!) 170/88  Pulse: 98 79  Resp: (!) 24 16  Temp:  98.5 F (36.9 C)  SpO2: 99% 97%   General: Awake, oriented x4. CV:  Good peripheral perfusion. Resp:  Normal effort. Abd:  No distention. Other:  Middle-aged overweight Caucasian male resting comfortably in no acute distress.  Left-sided facial droop, left facial numbness, left upper and lower extremity flaccid paralysis, decree sensation to left upper and lower extremities.   Slurred speech ED Results / Procedures / Treatments  Labs (all labs ordered are listed, but only abnormal results are displayed) Labs Reviewed  COMPREHENSIVE METABOLIC PANEL WITH GFR - Abnormal; Notable for the following components:      Result Value   Chloride 96 (*)    CO2 20 (*)    Glucose, Bld 153 (*)    AST 60 (*)    ALT 63 (*)    Total Bilirubin 1.7 (*)    Anion gap 20 (*)    All other components within normal limits  BLOOD GAS, VENOUS - Abnormal; Notable for the following components:   pH, Ven 7.44 (*)    pCO2, Ven 33 (*)    pO2, Ven 71 (*)    All other components within normal limits  CBG MONITORING, ED - Abnormal; Notable for the following components:   Glucose-Capillary 160 (*)    All other components within normal limits  PROTIME-INR  APTT  CBC  DIFFERENTIAL  LACTIC ACID, PLASMA  LIPID PANEL  MAGNESIUM   HEMOGLOBIN A1C  HIV ANTIBODY (ROUTINE TESTING W REFLEX)  BASIC METABOLIC PANEL WITH GFR  CBC  EKG ED ECG REPORT I, Artist MARLA Kerns, the attending physician, personally viewed and interpreted this ECG. Date: 03/13/2024 EKG Time: 1444 Rate: 97 Rhythm: Sinus arrhythmia QRS Axis: normal Intervals: normal ST/T Wave abnormalities: normal Narrative Interpretation: Sinus arrhythmia.  No evidence of acute ischemia RADIOLOGY ED MD interpretation: CT of the head without contrast interpreted by me shows no evidence of acute abnormalities including no intracerebral hemorrhage, obvious masses, or significant edema  CT angiography of the head and neck independently interpreted and shows no large vessel occlusion with moderate to severe stenosis of the origin of the nondominant right vertebral artery - All radiology independently interpreted and agree with radiology assessment Official radiology report(s): MR BRAIN WO CONTRAST Result Date: 03/13/2024 EXAM: MRI BRAIN WITHOUT CONTRAST 03/13/2024 09:02:44 PM TECHNIQUE: Multiplanar multisequence MRI of the head/brain was  performed without the administration of intravenous contrast. COMPARISON: None available. CLINICAL HISTORY: Neuro deficit, acute, stroke suspected. FINDINGS: BRAIN AND VENTRICLES: Acute infarct within the right pons of intermediate size. No other area of acute ischemia. Multifocal hyperintense T2-weighted signal within the cerebral white matter, most commonly due to chronic small vessel disease. Generalized volume loss. 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 No intracranial hemorrhage, mass, midline shift, or hydrocephalus. The sella is unremarkable. Normal flow voids. ORBITS: No acute abnormality. SINUSES AND MASTOIDS: Right maxillary and left sphenoid sinus mucosal thickening and opacification of the frontal sinus. BONES AND SOFT TISSUES: Normal marrow signal. No acute soft tissue abnormality. IMPRESSION: 1. 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 2. Multifocal cerebral white matter T2 hyperintensities consistent with chronic small vessel disease. 3. Generalized cerebral volume loss. Electronically signed by: Franky Stanford MD 03/13/2024 09:07 PM EST RP Workstation: [id number]   CT ANGIO HEAD NECK W WO CM Result Date: 03/13/2024 EXAM: CTA HEAD AND NECK WITHOUT AND WITH 03/13/2024 04:45:36 PM TECHNIQUE: CTA of the head and neck was performed without and with the administration of 75 mL of iohexol (OMNIPAQUE) 350 MG/ML injection. Multiplanar 2D and/or 3D reformatted images are provided for review. Automated exposure control, iterative reconstruction, and/or weight based adjustment of the mA/kV was utilized to reduce the radiation dose to as low as reasonably achievable. Stenosis of the internal carotid arteries measured using NASCET criteria. COMPARISON: None available CLINICAL HISTORY: Neuro deficit, acute, stroke suspected. New onset of left sided facial droop, slurred speech, and left leg numbness beginning 2 days ago. FINDINGS: CTA NECK: AORTIC ARCH AND ARCH VESSELS: No dissection or arterial  injury. No significant stenosis of the brachiocephalic or subclavian arteries. CERVICAL CAROTID ARTERIES: No dissection or arterial injury. Small to moderate amount of calcified greater than soft plaque in the distal common and proximal internal carotid arteries on the right and a smaller amount of calcified plaque on the left. No hemodynamically significant stenosis by NASCET criteria. CERVICAL VERTEBRAL ARTERIES: Dominant left vertebral artery. Calcified plaque at the origin of the right vertebral artery results in moderate to severe stenosis. No dissection or arterial injury. LUNGS AND MEDIASTINUM: Unremarkable. SOFT TISSUES: No acute abnormality. BONES: Numerous dental caries. Interbody and facet ankylosis at C4-C5 and C5-C6. Asymmetrically severe left facet arthrosis at C2-C3 and C3-C4. Multilevel bridging anterior vertebral osteophytes in the cervical and upper thoracic spine. CTA HEAD: ANTERIOR CIRCULATION: The intracranial internal carotid arteries are patent with calcified plaque resulting in mild to moderate proximal left supraclinoid stenosis and no significant stenosis on the right. ACAs and MCAs are patent without evidence of a proximal branch occlusion or significant proximal stenosis. No aneurysm. POSTERIOR CIRCULATION: The  intracranial vertebral arteries are patent with calcified plaque resulting in mild stenosis on the left. Patent PICA and SCA origins are visualized bilaterally. The basilar artery is widely patent. Posterior communicating arteries are diminutive or absent. Both PCAs are patent without evidence of a significant proximal stenosis. Posterior communicating arteries are diminutive or absent. No aneurysm. OTHER: No dural venous sinus thrombosis on this non-dedicated study. IMPRESSION: 1. No large vessel occlusion. 2. 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 3. 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 Electronically signed by: Dasie Hamburg MD 03/13/2024 05:09 PM EST RP Workstation: [id number]   CT HEAD WO CONTRAST Result Date: 03/13/2024 EXAM: CT HEAD WITHOUT 03/13/2024 03:36:00 PM TECHNIQUE: CT of the head was performed without the administration of intravenous contrast. Automated exposure control, iterative reconstruction, and/or weight based adjustment of the mA/kV was utilized to reduce the radiation dose to as low as reasonably achievable. COMPARISON: None available. CLINICAL HISTORY: Neuro deficit, acute, stroke suspected. Left sided weakness. Altered mental status. FINDINGS: BRAIN AND VENTRICLES: There is no evidence of an acute infarct, intracranial hemorrhage, mass, midline shift, hydrocephalus, or extra-axial fluid collection. There is mild cerebral atrophy. Cerebral white matter hypodensities are nonspecific but compatible with mild chronic small vessel ischemic disease. Calcified atherosclerosis at the skull base. ORBITS: No acute abnormality. SINUSES AND MASTOIDS: Complete opacification of the frontal sinus with surrounding osteitis consistent with chronic sinusitis. Moderate bilateral ethmoid air cell opacification. Prominent circumferential mucosal thickening in the right maxillary sinus. Large fluid level in the left sphenoid sinus. Clear mastoid air cells. SOFT TISSUES AND SKULL: No acute skull fracture. No acute soft tissue abnormality. IMPRESSION: 1. No acute intracranial abnormality. 2. Mild chronic small vessel ischemic disease. 3. Pansinusitis. Electronically signed by: Dasie Hamburg MD 03/13/2024 03:51 PM EST RP Workstation: [id number]   PROCEDURES: Critical Care performed: Yes, see critical care procedure note(s) Procedures CRITICAL CARE Performed by: Silvana Holecek K Shariq Puig  Total critical care time: 41 minutes  Critical care time was exclusive of separately billable procedures and treating other patients.  Critical care was necessary to treat or prevent imminent or life-threatening deterioration.  Critical care  was time spent personally by me on the following activities: development of treatment plan with patient and/or surrogate as well as nursing, discussions with consultants, evaluation of patient's response to treatment, examination of patient, obtaining history from patient or surrogate, ordering and performing treatments and interventions, ordering and review of laboratory studies, ordering and review of radiographic studies, pulse oximetry and re-evaluation of patient's condition.  MEDICATIONS ORDERED IN ED: Medications  LORazepam (ATIVAN) tablet 1-4 mg (has no administration in time range)    Or  LORazepam (ATIVAN) injection 1-4 mg (has no administration in time range)  thiamine (VITAMIN B1) tablet 100 mg ( Oral See Alternative 03/13/24 1808)    Or  thiamine (VITAMIN B1) injection 100 mg (100 mg Intravenous Given 03/13/24 1808)  folic acid (FOLVITE) tablet 1 mg (1 mg Oral Given 03/13/24 1808)  multivitamin with minerals tablet 1 tablet (1 tablet Oral Given 03/13/24 1808)  sodium chloride  flush (NS) 0.9 % injection 3 mL (has no administration in time range)  acetaminophen  (TYLENOL ) tablet 650 mg (has no administration in time range)    Or  acetaminophen  (TYLENOL ) suppository 650 mg (has no administration in time range)  senna-docusate (Senokot-S) tablet 1 tablet (has no administration in time range)  heparin injection 5,000 Units (has no administration in time range)  ondansetron  (ZOFRAN ) tablet 4 mg (  has no administration in time range)    Or  ondansetron  (ZOFRAN ) injection 4 mg (has no administration in time range)   stroke: early stages of recovery book (has no administration in time range)  traZODone (DESYREL) tablet 25 mg (has no administration in time range)  insulin  aspart (novoLOG ) injection 0-9 Units (has no administration in time range)  iohexol (OMNIPAQUE) 350 MG/ML injection 75 mL (75 mLs Intravenous Contrast Given 03/13/24 1633)  aspirin chewable tablet 81 mg (81 mg Oral Given  03/13/24 1727)  clopidogrel (PLAVIX) tablet 300 mg (300 mg Oral Given 03/13/24 1727)  amLODipine  (NORVASC ) tablet 5 mg (5 mg Oral Given 03/13/24 1815)   IMPRESSION / MDM / ASSESSMENT AND PLAN / ED COURSE  I reviewed the triage vital signs and the nursing notes.                             The patient is on the cardiac monitor to evaluate for evidence of arrhythmia and/or significant heart rate changes. Patient's presentation is most consistent with acute presentation with potential threat to life or bodily function. Patient is a 63 year old male with the above-stated past medical history who presents with facial droop, slurred speech, left-sided weakness and numbness that began 2 days prior to arrival. DDx: Ischemic stroke, MS, radiculopathy, hemorrhagic stroke Plan: CBC, CMP, INR, PTT, head CT, EKG  Despite negative CT and CTA, patient's symptoms concerning for acute stroke and therefore patient will require admission in the internal medicine service for further evaluation and management.  I spoke to the on-call hospitalist team who agrees to accept this patient.  Dispo: Admit to medicine   FINAL CLINICAL IMPRESSION(S) / ED DIAGNOSES   Final diagnoses:  Facial droop  Muscle weakness of left upper extremity  Weakness of left lower extremity   Rx / DC Orders   ED Discharge Orders     None      Note:  This document was prepared using Dragon voice recognition software and may include unintentional dictation errors.   Hawk Mones K, MD 03/13/24 [phone]

## 2024-03-13 NOTE — H&P (Addendum)
 History and Physical    Terrence Sanchez. FMW:[id number] DOB: Oct 14, 1960 DOA: 03/13/2024  DOS: the patient was seen and examined on 03/13/2024  PCP: Pcp, No   Patient coming from: Home  I have personally briefly reviewed patient's old medical records in [facility] and CareEverywhere  HPI:   Terrence Sanchez. is a 63 y.o. year old male without significant known medical hx coming in for evaluation of left sided weakness.    The weakness has been going on for about 2 days. He stated he had no plans of coming to the ED but his son advised him to seek care as he could be having a stroke. Pt denies any acute complaints currently. He reports very concerned about his functional status.    On arrival to the ED patient was noted to be HDS stable. Lab work and imaging obtained. CBC without leukocytosis, CMP with AGMA, moderate hyperglycemia, slight AST/ALT elevation and bilirubin elevation. VBG without any acidosis and lactic acid normal. CTH without any acute findings, but it showed pansinusitis. 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 Given concern for stroke and need for stroke work up, TRH contacted for admission.  Review of Systems: As mentioned in the history of present illness. All other systems reviewed and are negative.   Past Medical History:  Diagnosis Date   Diabetes mellitus without complication (HCC)     Past Surgical History:  Procedure Laterality Date   FRACTURE SURGERY     INTRAMEDULLARY (IM) NAIL INTERTROCHANTERIC Right 02/12/2022   Procedure: INTRAMEDULLARY (IM) NAIL INTERTROCHANTERIC;  Surgeon: Edie Norleen PARAS, MD;  Location: [facility] ORS;  Service: Orthopedics;  Laterality: Right;     Allergies[1]  History reviewed. No pertinent family history.  Prior to Admission medications  Medication Sig Start Date End Date Taking? Authorizing Provider  acetaminophen   (TYLENOL ) 325 MG tablet Take 650 mg by mouth every 4 (four) hours as needed for moderate pain (pain score 4-6) or mild pain (pain score 1-3).   Yes [provider]  Multiple Vitamin (MULTIVITAMIN WITH MINERALS) TABS tablet Take 1 tablet by mouth daily. 02/15/22  Yes Patel, Sona, MD  amLODipine  (NORVASC ) 5 MG tablet Take 1 tablet (5 mg total) by mouth daily. Patient not taking: Reported on 03/13/2024 02/15/22   Tobie Calix, MD  bisacodyl  (DULCOLAX) 10 MG suppository Place 1 suppository (10 mg total) rectally daily as needed for moderate constipation. Patient not taking: Reported on 03/13/2024 02/14/22   Patel, Sona, MD  docusate sodium  (COLACE) 100 MG capsule Take 1 capsule (100 mg total) by mouth 2 (two) times daily. Patient not taking: Reported on 03/13/2024 02/14/22   Patel, Sona, MD  enoxaparin  (LOVENOX ) 40 MG/0.4ML injection Inject 0.4 mLs (40 mg total) into the skin daily. Patient not taking: Reported on 03/13/2024 02/15/22   Kip Lynwood Double, PA-C  methocarbamol  (ROBAXIN ) 500 MG tablet Take 1 tablet (500 mg total) by mouth every 6 (six) hours as needed for muscle spasms. Patient not taking: Reported on 03/13/2024 02/14/22   Tobie Calix, MD  oxyCODONE  (OXY IR/ROXICODONE ) 5 MG immediate release tablet Take 1-2 tablets (5-10 mg total) by mouth every 4 (four) hours as needed for moderate pain (pain score 4-6). Patient not taking: Reported on 03/13/2024 02/14/22   Kip Lynwood Double, PA-C  Vitamins-Lipotropics (MEGA MULTIPLE/CHELATED MINERAL) TABS Take 1 tablet by mouth daily. Patient not taking: Reported on 03/13/2024 02/15/22  [provider]    Social History:  reports that he has never smoked. He has never used smokeless tobacco. He reports current alcohol use. No history on file for drug use. Lives alone, does not smoke and states is not drinking currently.    Physical Exam: Vitals:   03/13/24 1730 03/13/24 1815 03/13/24 1900 03/13/24 2009  BP: (!) 179/105 (!)  174/133 (!) 157/92 (!) 170/88  Pulse: (!) 104 95 98 79  Resp: 19 (!) 21 (!) 24 16  Temp:    98.5 F (36.9 C)  TempSrc:      SpO2:   99% 97%  Weight:      Height:        Gen: NAD HENT: NCAT CV: normal heart sounds Lung: CTAB Abd: No TTP, normal bowel sounds MSK: No asymmetry, good bulk and tone Neuro: alert and oriented, CNII-VI, left sided facial droop, CNVIII-XII intact, RUE and RLE 5/5 strength. 4/5 in LUE and LLE. Sensation intact.    Labs on Admission: I have personally reviewed following labs and imaging studies  CBC: Recent Labs  Lab 03/13/24 1452  WBC 7.6  NEUTROABS 6.0  HGB 15.4  HCT 43.3  MCV 95.6  PLT 159   Basic Metabolic Panel: Recent Labs  Lab 03/13/24 1452  NA 136  K 4.1  CL 96*  CO2 20*  GLUCOSE 153*  BUN 15  CREATININE 0.95  CALCIUM 9.4   GFR: Estimated Creatinine Clearance: 85.1 mL/min (by C-G formula based on SCr of 0.95 mg/dL). Liver Function Tests: Recent Labs  Lab 03/13/24 1452  AST 60*  ALT 63*  ALKPHOS 75  BILITOT 1.7*  PROT 7.2  ALBUMIN 4.6   No results for input(s): LIPASE, AMYLASE in the last 168 hours. No results for input(s): AMMONIA in the last 168 hours. Coagulation Profile: Recent Labs  Lab 03/13/24 1452  INR 1.1   Cardiac Enzymes: No results for input(s): CKTOTAL, CKMB, CKMBINDEX, TROPONINI, TROPONINIHS in the last 168 hours. BNP (last 3 results) No results for input(s): BNP in the last 8760 hours. HbA1C: No results for input(s): HGBA1C in the last 72 hours. CBG: Recent Labs  Lab 03/13/24 1456  GLUCAP 160*   Lipid Profile: No results for input(s): CHOL, HDL, LDLCALC, TRIG, CHOLHDL, LDLDIRECT in the last 72 hours. Thyroid Function Tests: No results for input(s): TSH, T4TOTAL, FREET4, T3FREE, THYROIDAB in the last 72 hours. Anemia Panel: No results for input(s): VITAMINB12, FOLATE, FERRITIN, TIBC, IRON, RETICCTPCT in the last 72 hours. Urine  analysis: No results found for: COLORURINE, APPEARANCEUR, LABSPEC, PHURINE, GLUCOSEU, HGBUR, BILIRUBINUR, KETONESUR, PROTEINUR, UROBILINOGEN, NITRITE, LEUKOCYTESUR  Radiological Exams on Admission: I have personally reviewed images CT ANGIO HEAD NECK W WO CM Result Date: 03/13/2024 EXAM: CTA HEAD AND NECK WITHOUT AND WITH 03/13/2024 04:45:36 PM TECHNIQUE: CTA of the head and neck was performed without and with the administration of 75 mL of iohexol (OMNIPAQUE) 350 MG/ML injection. Multiplanar 2D and/or 3D reformatted images are provided for review. Automated exposure control, iterative reconstruction, and/or weight based adjustment of the mA/kV was utilized to reduce the radiation dose to as low as reasonably achievable. Stenosis of the internal carotid arteries measured using NASCET criteria. COMPARISON: None available CLINICAL HISTORY: Neuro deficit, acute, stroke suspected. New onset of left sided facial droop, slurred speech, and left leg numbness beginning 2 days ago. FINDINGS: CTA NECK: AORTIC ARCH AND ARCH VESSELS: No dissection or arterial injury. No significant stenosis of the brachiocephalic or subclavian arteries. CERVICAL CAROTID ARTERIES: No dissection  or arterial injury. Small to moderate amount of calcified greater than soft plaque in the distal common and proximal internal carotid arteries on the right and a smaller amount of calcified plaque on the left. No hemodynamically significant stenosis by NASCET criteria. CERVICAL VERTEBRAL ARTERIES: Dominant left vertebral artery. Calcified plaque at the origin of the right vertebral artery results in moderate to severe stenosis. No dissection or arterial injury. LUNGS AND MEDIASTINUM: Unremarkable. SOFT TISSUES: No acute abnormality. BONES: Numerous dental caries. Interbody and facet ankylosis at C4-C5 and C5-C6. Asymmetrically severe left facet arthrosis at C2-C3 and C3-C4. Multilevel bridging anterior vertebral osteophytes  in the cervical and upper thoracic spine. CTA HEAD: ANTERIOR CIRCULATION: The intracranial internal carotid arteries are patent with calcified plaque resulting in mild to moderate proximal left supraclinoid stenosis and no significant stenosis on the right. ACAs and MCAs are patent without evidence of a proximal branch occlusion or significant proximal stenosis. No aneurysm. POSTERIOR CIRCULATION: The intracranial vertebral arteries are patent with calcified plaque resulting in mild stenosis on the left. Patent PICA and SCA origins are visualized bilaterally. The basilar artery is widely patent. Posterior communicating arteries are diminutive or absent. Both PCAs are patent without evidence of a significant proximal stenosis. Posterior communicating arteries are diminutive or absent. No aneurysm. OTHER: No dural venous sinus thrombosis on this non-dedicated study. IMPRESSION: 1. No large vessel occlusion. 2. 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 3. 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 Electronically signed by: Dasie Hamburg MD 03/13/2024 05:09 PM EST RP Workstation: [id number]   CT HEAD WO CONTRAST Result Date: 03/13/2024 EXAM: CT HEAD WITHOUT 03/13/2024 03:36:00 PM TECHNIQUE: CT of the head was performed without the administration of intravenous contrast. Automated exposure control, iterative reconstruction, and/or weight based adjustment of the mA/kV was utilized to reduce the radiation dose to as low as reasonably achievable. COMPARISON: None available. CLINICAL HISTORY: Neuro deficit, acute, stroke suspected. Left sided weakness. Altered mental status. FINDINGS: BRAIN AND VENTRICLES: There is no evidence of an acute infarct, intracranial hemorrhage, mass, midline shift, hydrocephalus, or extra-axial fluid collection. There is mild cerebral atrophy. Cerebral white matter hypodensities are nonspecific but compatible with mild chronic small vessel  ischemic disease. Calcified atherosclerosis at the skull base. ORBITS: No acute abnormality. SINUSES AND MASTOIDS: Complete opacification of the frontal sinus with surrounding osteitis consistent with chronic sinusitis. Moderate bilateral ethmoid air cell opacification. Prominent circumferential mucosal thickening in the right maxillary sinus. Large fluid level in the left sphenoid sinus. Clear mastoid air cells. SOFT TISSUES AND SKULL: No acute skull fracture. No acute soft tissue abnormality. IMPRESSION: 1. No acute intracranial abnormality. 2. Mild chronic small vessel ischemic disease. 3. Pansinusitis. Electronically signed by: Dasie Hamburg MD 03/13/2024 03:51 PM EST RP Workstation: [id number]    EKG: My personal interpretation of EKG shows: sinus rhythm without any acute ST changes.     Assessment/Plan Principal Problem:   Cerebral infarction [facility]) Active Problems:   Hyperglycemia   Transaminitis  Cerebral Infarction Pt with symptoms of weakness, slurred speech with concern for stroke. Pt outside the window of tPA and no LVO. Neurology consulted, appreciate their recommendations.  - Allow for permissive HTN in the setting of acute stroke  - ASA 81 mg daily and Plavix 75 mg for 21 days followed by ASA alone - High Intensity Statin - Echocardiogram  - Carotid doppler or CTA head & neck  - A1C  - Lipid panel  - Tele monitoring  -  SLP eval - PT/OT   Elevated glucose: likely uncontrolled DMII. sSSI.   Elevated blood pressure: likely uncontrolled htn, allow permissive htn for now but start antihypertensive at discharge.   Transaminatis: likely secondary to alcohol use. Pt giving mixed report of his alcohol use. Will get RUQ and hepatitis panel.   Alcohol use disorder: Place on CIWA with ativan.  He states he has not drank alcohol in weeks  VTE prophylaxis:  SQ Heparin  Diet: NPO Code Status:  Full Code Telemetry:  Admission status: Observation, Telemetry bed Patient is from:  Home Anticipated d/c is to: Home Anticipated d/c is in: 1-2 days   Family Communication: Updated at bedside  Consults called: EDP discussed with Dr. Germaine of neurology   Severity of Illness: The appropriate patient status for this patient is OBSERVATION. Observation status is judged to be reasonable and necessary in order to provide the required intensity of service to ensure the patient's safety. The patient's presenting symptoms, physical exam findings, and initial radiographic and laboratory data in the context of their medical condition is felt to place them at decreased risk for further clinical deterioration. Furthermore, it is anticipated that the patient will be medically stable for discharge from the hospital within 2 midnights of admission.    Morene Bathe, MD [place] DEL. [facility]      [1] No Known Allergies

## 2024-03-13 NOTE — Discharge Instructions (Signed)

## 2024-03-13 NOTE — ED Notes (Signed)
 Pt at CT

## 2024-03-13 NOTE — ED Triage Notes (Signed)
 Pt BIB [facility] from home due to a new onset of L sided facial droop, slurred speech and Loss of sensation in L leg that started Wednesday at 8pm. Pts son came to visit him at home since he Lives alone and told him to go to the ER. Pt is not on blood thinners. No complaints and is AXO  190/102 110 hr 142 CBG

## 2024-03-13 NOTE — TOC Initial Note (Signed)
 Transition of Care (TOC) - Initial/Assessment Note    Patient Details  Name: Terrence Sanchez. MRN: [id number] Date of Birth: [birth year]  Transition of Care [facility]) CM/SW Contact:    Layza Summa L Novia Lansberry, LCSW Phone Number: 03/13/2024, 6:00 PM  Clinical Narrative:                  [name] consult received for substance abuse education/counseling. TOC does not provide education/counseling. Resources were added to the AVS.        Patient Goals and CMS Choice            Expected Discharge Plan and Services                                              Prior Living Arrangements/Services                       Activities of Daily Living      Permission Sought/Granted                  Emotional Assessment              Admission diagnosis:  stroke Patient Active Problem List   Diagnosis Date Noted   ABLA (acute blood loss anemia) 02/13/2022   Overweight (BMI 25.0-29.9) 02/12/2022   Hypokalemia 02/12/2022   Intertrochanteric fracture of right femur, closed, initial encounter [facility]) 02/11/2022   Fall 02/11/2022   Hyperglycemia 02/11/2022   Transaminitis 02/11/2022   PCP:  Pcp, No Pharmacy:  No Pharmacies Listed    Social Drivers of Health (SDOH) Social History: SDOH Screenings   Food Insecurity: No Food Insecurity (02/13/2022)  Tobacco Use: Low Risk (03/13/2024)   SDOH Interventions:     Readmission Risk Interventions     No data to display

## 2024-03-13 NOTE — ED Notes (Signed)
 Admitting MD at bedside.

## 2024-03-14 ENCOUNTER — Observation Stay
Admit: 2024-03-14 | Discharge: 2024-03-14 | Disposition: A | Discharge status: Home / Self Care | Attending: Internal Medicine | Admitting: Internal Medicine

## 2024-03-14 ENCOUNTER — Observation Stay

## 2024-03-14 DIAGNOSIS — Z7902 Long term (current) use of antithrombotics/antiplatelets: Secondary | ICD-10-CM | POA: Diagnosis not present

## 2024-03-14 DIAGNOSIS — I639 Cerebral infarction, unspecified: Secondary | ICD-10-CM | POA: Diagnosis not present

## 2024-03-14 DIAGNOSIS — G4700 Insomnia, unspecified: Secondary | ICD-10-CM | POA: Diagnosis present

## 2024-03-14 DIAGNOSIS — I63311 Cerebral infarction due to thrombosis of right middle cerebral artery: Secondary | ICD-10-CM

## 2024-03-14 DIAGNOSIS — F1010 Alcohol abuse, uncomplicated: Secondary | ICD-10-CM | POA: Diagnosis present

## 2024-03-14 DIAGNOSIS — E1165 Type 2 diabetes mellitus with hyperglycemia: Secondary | ICD-10-CM | POA: Diagnosis present

## 2024-03-14 DIAGNOSIS — G8929 Other chronic pain: Secondary | ICD-10-CM | POA: Diagnosis present

## 2024-03-14 DIAGNOSIS — R7401 Elevation of levels of liver transaminase levels: Secondary | ICD-10-CM | POA: Diagnosis present

## 2024-03-14 DIAGNOSIS — R29810 Facial weakness: Secondary | ICD-10-CM | POA: Diagnosis present

## 2024-03-14 DIAGNOSIS — I6329 Cerebral infarction due to unspecified occlusion or stenosis of other precerebral arteries: Secondary | ICD-10-CM | POA: Diagnosis present

## 2024-03-14 DIAGNOSIS — I10 Essential (primary) hypertension: Secondary | ICD-10-CM | POA: Diagnosis present

## 2024-03-14 DIAGNOSIS — E1151 Type 2 diabetes mellitus with diabetic peripheral angiopathy without gangrene: Secondary | ICD-10-CM | POA: Diagnosis not present

## 2024-03-14 DIAGNOSIS — G8194 Hemiplegia, unspecified affecting left nondominant side: Secondary | ICD-10-CM | POA: Diagnosis present

## 2024-03-14 DIAGNOSIS — Z79899 Other long term (current) drug therapy: Secondary | ICD-10-CM | POA: Diagnosis not present

## 2024-03-14 DIAGNOSIS — R4781 Slurred speech: Secondary | ICD-10-CM | POA: Diagnosis present

## 2024-03-14 DIAGNOSIS — Z7982 Long term (current) use of aspirin: Secondary | ICD-10-CM | POA: Diagnosis not present

## 2024-03-14 DIAGNOSIS — Z981 Arthrodesis status: Secondary | ICD-10-CM | POA: Diagnosis not present

## 2024-03-14 LAB — CBC
HCT: 41.9 % (ref 39.0–52.0)
Hemoglobin: 14.7 g/dL (ref 13.0–17.0)
MCH: 33.8 pg (ref 26.0–34.0)
MCHC: 35.1 g/dL (ref 30.0–36.0)
MCV: 96.3 fL (ref 80.0–100.0)
Platelets: 155 K/uL (ref 150–400)
RBC: 4.35 MIL/uL (ref 4.22–5.81)
RDW: 14.4 % (ref 11.5–15.5)
WBC: 5.3 K/uL (ref 4.0–10.5)
nRBC: 0 % (ref 0.0–0.2)

## 2024-03-14 LAB — BASIC METABOLIC PANEL WITH GFR
Anion gap: 14 (ref 5–15)
BUN: 15 mg/dL (ref 8–23)
CO2: 23 mmol/L (ref 22–32)
Calcium: 9.5 mg/dL (ref 8.9–10.3)
Chloride: 99 mmol/L (ref 98–111)
Creatinine, Ser: 0.84 mg/dL (ref 0.61–1.24)
GFR, Estimated: >60 mL/min (ref >60)
Glucose, Bld: 128 mg/dL — ABNORMAL HIGH (ref 70–99)
Potassium: 3.5 mmol/L (ref 3.5–5.1)
Sodium: 136 mmol/L (ref 135–145)

## 2024-03-14 LAB — HIV ANTIBODY (ROUTINE TESTING W REFLEX): HIV Screen 4th Generation wRfx: NONREACTIVE

## 2024-03-14 LAB — GLUCOSE, CAPILLARY
Glucose-Capillary: 144 mg/dL — ABNORMAL HIGH (ref 70–99)
Glucose-Capillary: 145 mg/dL — ABNORMAL HIGH (ref 70–99)
Glucose-Capillary: 140 mg/dL — ABNORMAL HIGH (ref 70–99)
Glucose-Capillary: 138 mg/dL — ABNORMAL HIGH (ref 70–99)

## 2024-03-14 MED ORDER — ATORVASTATIN CALCIUM 20 MG PO TABS
40.0000 mg | ORAL_TABLET | Freq: Every day | ORAL | Status: DC
  Administered 2024-03-14 – 2024-03-17 (×4): 40 mg via ORAL
  Filled 2024-03-14 (×4): qty 2

## 2024-03-14 MED ORDER — CLOPIDOGREL BISULFATE 75 MG PO TABS
75.0000 mg | ORAL_TABLET | Freq: Every day | ORAL | Status: DC
  Administered 2024-03-14 – 2024-03-17 (×4): 75 mg via ORAL
  Filled 2024-03-14 (×4): qty 1

## 2024-03-14 MED ORDER — HYDROCERIN EX CREA
TOPICAL_CREAM | Freq: Two times a day (BID) | CUTANEOUS | Status: DC
  Filled 2024-03-14: qty 113

## 2024-03-14 MED ORDER — ASPIRIN 81 MG PO CHEW
81.0000 mg | CHEWABLE_TABLET | Freq: Every day | ORAL | Status: DC
  Administered 2024-03-14 – 2024-03-17 (×4): 81 mg via ORAL
  Filled 2024-03-14 (×4): qty 1

## 2024-03-14 NOTE — Evaluation (Signed)
 Physical Therapy Evaluation Patient Details Name: Terrence Sanchez. MRN: [id number] DOB: [birth year] Today's Date: 03/14/2024  History of Present Illness  63 y.o. male with a history of DM and multiple RLE surgeries leading to weakness in the RLE who presents with complaints of left sided weakness.  Patient reports that on 12/10 at 2000 noted left sided facial droop, slurred speech and decreased sensation on the LUE and LLE.  Also developed left sided weakness as well.  With no improvement presented for evaluation.  Reports that since admission has had worsening speech and left upper extremity weakness.  Clinical Impression  Pt eager to work with PT and see what he could but but was clearly very frustrated with the profundity of his L U>LE weakness.  He was truly flaccid t/o the L UE and had very little active control in the LE.  He uses R LE boot and b/l crutches at baseline, pt currently very functionally limited with mobility, and though we were able to stand at EOB for 20-30 seconds today it was heavily assist the entire time and he struggled to shift weight onto the walker (set up for single UE use).  Pt will clearly need ongoing PT services to address functional limitations as he is far from his baseline.      If plan is discharge home, recommend the following: Two people to help with walking and/or transfers;Two people to help with bathing/dressing/bathroom;Assistance with cooking/housework;Assistance with feeding;Direct supervision/assist for medications management;Assist for transportation;Help with stairs or ramp for entrance   Can travel by private vehicle   No    Equipment Recommendations  (TBD At next venue of care, likely will need w/c)  Recommendations for Other Services       Functional Status Assessment Patient has had a recent decline in their functional status and demonstrates the ability to make significant improvements in function in a reasonable and predictable amount  of time.     Precautions / Restrictions Precautions Precautions: Fall Restrictions Weight Bearing Restrictions Per Provider Order: No Other Position/Activity Restrictions: chronic R foot limitations/wound - no formal orders but has boot that he wears when WBing      Mobility  Bed Mobility Overal bed mobility: Needs Assistance Bed Mobility: Supine to Sit, Sidelying to Sit   Sidelying to sit: Max assist, Mod assist Supine to sit: Max assist     General bed mobility comments: Pt able to give some effort with R LE helping L LE move a little, heavy assist to pull up to sitting EOB - unable to use L UE    Transfers Overall transfer level: Needs assistance Equipment used: Quad cane ((cross bar of elevated RW simulating HW)) Transfers: Sit to/from Stand Sit to Stand: Mod assist           General transfer comment: PT helped don R boot, pt able to give some effort with R L/UEs to try and attain standing but ultimately needed heavy assist to get to and maintain upright standing.  Pt leaning back of LEs on bed and unable to shift forward.    Ambulation/Gait               General Gait Details: unable/unsafe  Stairs            Wheelchair Mobility     Tilt Bed    Modified Rankin (Stroke Patients Only)       Balance Overall balance assessment: Needs assistance Sitting-balance support: Single extremity supported Sitting balance-Leahy Scale: Good Sitting  balance - Comments: once assisted to EOB able to maintain sitting relatively well     Standing balance-Leahy Scale: Poor Standing balance comment: heavy assist to get to standing, unable to effectively take weight through R U&LE well enough to maintain standing w/o significant assist                             Pertinent Vitals/Pain Pain Assessment Pain Assessment:  (minimal chronic R foot pain  Simultaneous filing. User may not have seen previous data.)    Home Living Family/patient expects  to be discharged to:: Skilled nursing facility                   Additional Comments: pt lives alone, does not drive, uses crutches at baseline, son checks in 3-4X/wk    Prior Function                       Extremity/Trunk Assessment   Upper Extremity Assessment Upper Extremity Assessment:  (L UE flaccid (report L UE has regressed last 24 hours), R grossly functional t/o)    Lower Extremity Assessment Lower Extremity Assessment: Generalized weakness (R hip and knee, grossly 4/5 with R ankle chronically interted and DF.  L hip grossly 2/5, L knee grossly 2+/5, L ankle/foot with trace wiggle/movement)       Communication   Communication Communication: No apparent difficulties    Cognition Arousal: Alert Behavior During Therapy: WFL for tasks assessed/performed   PT - Cognitive impairments: No apparent impairments                         Following commands: Intact       Cueing Cueing Techniques: Verbal cues, Gestural cues, Tactile cues, Other (comments)     General Comments General comments (skin integrity, edema, etc.): Pt extremely frustrated at his newly decreased level of mobility and L UE flaccidity    Exercises General Exercises - Lower Extremity Ankle Circles/Pumps: PROM, 10 reps, Left Quad Sets: AROM, 10 reps, Left Heel Slides: AAROM, 10 reps, Left (with resisted leg ext) Hip ABduction/ADduction: AAROM, 10 reps, Left Straight Leg Raises: AAROM, Left (a few reps with minimal elevation)   Assessment/Plan    PT Assessment Patient needs continued PT services  PT Problem List Decreased strength;Decreased range of motion;Decreased activity tolerance;Decreased balance;Decreased mobility;Decreased coordination;Decreased knowledge of use of DME;Decreased safety awareness;Decreased knowledge of precautions       PT Treatment Interventions DME instruction;Functional mobility training;Therapeutic activities;Therapeutic exercise;Balance  training;Neuromuscular re-education;Patient/family education;Wheelchair mobility training    PT Goals (Current goals can be found in the Care Plan section)  Acute Rehab PT Goals Patient Stated Goal: get back to walking PT Goal Formulation: With patient Time For Goal Achievement: 03/27/24 Potential to Achieve Goals: Fair    Frequency 7X/week     Co-evaluation               AM-PAC PT 6 Clicks Mobility  Outcome Measure Help needed turning from your back to your side while in a flat bed without using bedrails?: A Lot Help needed moving from lying on your back to sitting on the side of a flat bed without using bedrails?: A Lot Help needed moving to and from a bed to a chair (including a wheelchair)?: Total Help needed standing up from a chair using your arms (e.g., wheelchair or bedside chair)?: Total Help needed to walk in hospital  room?: Total Help needed climbing 3-5 steps with a railing? : Total 6 Click Score: 8    End of Session Equipment Utilized During Treatment: Gait belt Activity Tolerance: Patient tolerated treatment well (significant acute onset limitations) Patient left: with bed alarm set;with call bell/phone within reach Nurse Communication: Mobility status PT Visit Diagnosis: Muscle weakness (generalized) (M62.81);Difficulty in walking, not elsewhere classified (R26.2);Hemiplegia and hemiparesis;Other symptoms and signs involving the nervous system (R29.898);Unsteadiness on feet (R26.81) Hemiplegia - Right/Left: Left Hemiplegia - dominant/non-dominant: Non-dominant Hemiplegia - caused by: Cerebral infarction    Time: 0850-0930 PT Time Calculation (min) (ACUTE ONLY): 40 min   Charges:   PT Evaluation $PT Eval Moderate Complexity: 1 Mod PT Treatments $Therapeutic Exercise: 8-22 mins PT General Charges $$ ACUTE PT VISIT: 1 Visit         Carmin JONELLE Deed, DPT 03/14/2024, 11:13 AM

## 2024-03-14 NOTE — Evaluation (Signed)
 Occupational Therapy Evaluation Patient Details Name: Terrence Sanchez. MRN: [id number] DOB: December 19, 1960 Today's Date: 03/14/2024   History of Present Illness   Pt is a 63 y.o. male presenting to ED for evaluation of L-sided facial droop, weakness, loss of sensation and slurred speech. Pt additionally has a full thickness R foot ulcer. CT head neg, MRI showed acute R pontine infarct. PMH significant for DM, multiple failed surgeries for R ankle fx, and R hip ORIF 2023.     Clinical Impressions Pt admitted with above. Prior to admission, pt lives alone with son checking in multiple times a week. Pt has chronic R foot/ankle issues with foot ulcer and decreased ROM to R ankle, resulting in use of R boot and bilateral crutches for mobility. Typically mod independent for ADL performance. Pt presents to acute OT services with L-sided hemiparesis resulting in LUE grossly flaccid with trace activation in scapular elevation and tricep only, no active ROM for functional use distally or proximally. Requires MAX A for bed mobility, able to sit at EOB with good dynamic sitting balance for ADLs, MAX A for UB bathing + dressing using compensatory strategies for hemibody techniques, anticipate +2 for standing attempts. Pt would benefit from skilled OT services to address noted impairments and functional limitations (see below for any additional details) in order to maximize safety and independence while minimizing falls risk and caregiver burden. Anticipate the need for follow up OT services upon acute hospital DC. Patient will benefit from continued inpatient follow up therapy, <3 hours/day.      If plan is discharge home, recommend the following:   Two people to help with walking and/or transfers;Two people to help with bathing/dressing/bathroom     Functional Status Assessment   Patient has had a recent decline in their functional status and demonstrates the ability to make significant improvements in  function in a reasonable and predictable amount of time.     Equipment Recommendations   None recommended by OT (defer to next LOC)      Precautions/Restrictions   Precautions Precautions: Fall Recall of Precautions/Restrictions: Intact Precaution/Restrictions Comments: LUE hemiparesis Restrictions Weight Bearing Restrictions Per Provider Order: No Other Position/Activity Restrictions: chronic R foot limitations/wound - no formal orders but has boot that he wears when WBing     Mobility Bed Mobility Overal bed mobility: Needs Assistance Bed Mobility: Supine to Sit, Sit to Supine   Sidelying to sit: Max assist, Mod assist Supine to sit: Max assist Sit to supine: Max assist   General bed mobility comments: MAX A. pt able to use trunk and momentum to rise to a seated position. good static sitting balance. unable to use LUE.    Transfers Overall transfer level: Needs assistance                 General transfer comment: NT      Balance Overall balance assessment: Needs assistance Sitting-balance support: Single extremity supported Sitting balance-Leahy Scale: Good Sitting balance - Comments: good static / dynamic sitting                                   ADL either performed or assessed with clinical judgement   ADL Overall ADL's : Needs assistance/impaired         Upper Body Bathing: Maximal assistance;Sitting;Cueing for compensatory techniques;Cueing for sequencing;Cueing for safety;Cueing for UE precautions Upper Body Bathing Details (indicate cue type and reason): sitting at  EOB Lower Body Bathing: Maximal assistance;Sitting/lateral leans   Upper Body Dressing : Maximal assistance;Cueing for sequencing;Cueing for compensatory techniques Upper Body Dressing Details (indicate cue type and reason): hemi dressing techniques taught and MAX A to return demo x 2                 Functional mobility during ADLs: +2 for physical  assistance;+2 for safety/equipment;Maximal assistance General ADL Comments: anticipate need for +2 for safe transfers and futher OOB mobility     Vision Baseline Vision/History: 1 Wears glasses Ability to See in Adequate Light: 0 Adequate Patient Visual Report: No change from baseline              Pertinent Vitals/Pain Pain Assessment Pain Assessment: No/denies pain     Extremity/Trunk Assessment Upper Extremity Assessment Upper Extremity Assessment: Right hand dominant;LUE deficits/detail LUE Deficits / Details: pt reports regression of LUE functionality since yesterday. trace activation in scapular elevation and tricep, flaccid otherwise. no active grip or tone in bicep. LUE Sensation: WNL LUE Coordination: decreased fine motor;decreased gross motor   Lower Extremity Assessment Lower Extremity Assessment: Defer to PT evaluation   Cervical / Trunk Assessment Cervical / Trunk Assessment: Normal   Communication Communication Communication: Impaired Factors Affecting Communication: Reduced clarity of speech (dysarthria, L facial droop)   Cognition Arousal: Alert Behavior During Therapy: WFL for tasks assessed/performed Cognition: No apparent impairments                               Following commands: Intact       Cueing  General Comments   Cueing Techniques: Verbal cues;Gestural cues;Tactile cues;Other (comments)  Pt emotinal regarding current functional status and lack of LUE use   Exercises Exercises: Other exercises Other Exercises Other Exercises: edu on role/purpose of OT, positioning of hemiparetic UE to prevent subux   Shoulder Instructions      Home Living Family/patient expects to be discharged to:: Private residence Living Arrangements: Alone Available Help at Discharge: Family;Available PRN/intermittently (son checks in several times a week) Type of Home: House Home Access: Stairs to enter [employer] of Steps:  2 Entrance Stairs-Rails: None Home Layout: One level     Bathroom Shower/Tub: Chief Strategy Officer: Standard     Home Equipment: Crutches      Lives With: Alone    Prior Functioning/Environment Prior Level of Function : Independent/Modified Independent             Mobility Comments: uses crutches and R foot boot at baseline ADLs Comments: mod independent    OT Problem List: Decreased strength;Decreased range of motion;Impaired balance (sitting and/or standing);Impaired UE functional use;Impaired tone;Decreased knowledge of precautions;Decreased knowledge of use of DME or AE;Decreased coordination   OT Treatment/Interventions: Self-care/ADL training;Neuromuscular education;Patient/family education;Balance training;DME and/or AE instruction;Therapeutic activities;Energy conservation;Therapeutic exercise      OT Goals(Current goals can be found in the care plan section)   Acute Rehab OT Goals OT Goal Formulation: With patient Time For Goal Achievement: 03/28/24 Potential to Achieve Goals: Good   OT Frequency:  Min 3X/week    Co-evaluation              AM-PAC OT 6 Clicks Daily Activity     Outcome Measure Help from another person eating meals?: A Little Help from another person taking care of personal grooming?: A Lot Help from another person toileting, which includes using toliet, bedpan, or urinal?: A Lot Help  from another person bathing (including washing, rinsing, drying)?: A Lot Help from another person to put on and taking off regular upper body clothing?: A Lot Help from another person to put on and taking off regular lower body clothing?: A Lot 6 Click Score: 13   End of Session Nurse Communication: Mobility status  Activity Tolerance: Patient tolerated treatment well;No increased pain Patient left: in bed;with call bell/phone within reach;with nursing/sitter in room;with bed alarm set  OT Visit Diagnosis: Hemiplegia and  hemiparesis;Other abnormalities of gait and mobility (R26.89);Unsteadiness on feet (R26.81);Other symptoms and signs involving the nervous system (R29.898) Hemiplegia - Right/Left: Left Hemiplegia - dominant/non-dominant: Non-Dominant Hemiplegia - caused by: Cerebral infarction                Time: 8492-8441 OT Time Calculation (min): 51 min Charges:  OT General Charges $OT Visit: 1 Visit OT Evaluation $OT Eval Moderate Complexity: 1 Mod OT Treatments $Self Care/Home Management : 38-52 mins Tyrik Stetzer L. Kayde Atkerson, OTR/L  03/14/2024, 4:49 PM

## 2024-03-14 NOTE — Consult Note (Addendum)
 WOC Nurse Consult Note: Reason for Consult: R foot ulcer  Wound type: full thickness R plantar foot r/t neuropathy  Pressure Injury POA: NA not pressure  Measurement: see nursing flowsheet  Wound bed: dark callus with depth  Drainage (amount, consistency, odor) see nursing flowsheet  Periwound: dry scaly hyperkeratotic skin  Dressing procedure/placement/frequency: Cleanse R foot with soap and water, dry.  Cleanse R plantar foot wound with Betadine, using a Q tip applicator insert a small piece of silver hydrofiber Soila [phone] Aquacel AG) to wound bed daily and secure with silicone foam.   Will write for Eucerin 2 times daily to feet and legs.  Do not place in between toes.    POC discussed with bedside nurse. WOC team will not follow. Appreciate K. Therisa, RN assistance with this consult.   Thank you,    Terrence Bar MSN, RN-BC, [employer]

## 2024-03-14 NOTE — Evaluation (Signed)
 Speech Language Pathology Evaluation Patient Details Name: Terrence Sanchez. MRN: [id number] DOB: 12-07-1960 Today's Date: 03/14/2024 Time: 1000-1030 SLP Time Calculation (min) (ACUTE ONLY): 30 min  Problem List:  Patient Active Problem List   Diagnosis Date Noted   Cerebral infarction (HCC) 03/13/2024   ABLA (acute blood loss anemia) 02/13/2022   Overweight (BMI 25.0-29.9) 02/12/2022   Hypokalemia 02/12/2022   Intertrochanteric fracture of right femur, closed, initial encounter (HCC) 02/11/2022   Fall 02/11/2022   Hyperglycemia 02/11/2022   Transaminitis 02/11/2022   Past Medical History:  Past Medical History:  Diagnosis Date   Diabetes mellitus without complication (HCC)    Past Surgical History:  Past Surgical History:  Procedure Laterality Date   FRACTURE SURGERY     INTRAMEDULLARY (IM) NAIL INTERTROCHANTERIC Right 02/12/2022   Procedure: INTRAMEDULLARY (IM) NAIL INTERTROCHANTERIC;  Surgeon: Edie Norleen PARAS, MD;  Location: [facility] ORS;  Service: Orthopedics;  Laterality: Right;   HPI:  Per neurology note, Terrence Sanchez. is an 63 y.o. male with a history of DM and multiple RLE surgeries leading to weakness in the RLE who presents with complaints of left sided weakness.  Patient reports that on 12/10 at 2000 noted left sided facial droop, slurred speech and decreased sensation on the LUE and LLE.  Also developed left sided weakness as well.  With no improvement presented for evaluation.  Reports that since admission has had worsening speech and left upper extremity weakness MRI: Acute right pontine infarct of intermediate size.  2. Multifocal cerebral white matter T2 hyperintensities consistent with chronic  small vessel disease.  3. Generalized cerebral volume loss.   Assessment / Plan / Recommendation Clinical Impression  Pt seen for motor speech evaluation in the setting of acute CVA (MRI revealing acute right pontine infarct). Pt presents with moderate flaccid  dysarthria, notable for reduced articulation precision and low intensity. Intelligibility relatively preserved at the conversational level (greater than 85% t/o session). Direct connection to left labial weakness and asymmetry. Education shared regarding compensatory strategies, including over-articulation, slowed production, increasing volume, and pausing between utterance. Following demonstration, pt able to apply to conversational speech. Cognition appears grossly intact for attention, insight, functional recall, orientation, safety reasoning, visual processing, and language. Pt notably tearful and frustrated with worsening weakness, active listening and time provided to express concerns. SLP will follow acutely for additional compensatory strategy training and education. Recommend follow up SLP services at discharge.     SLP Assessment  SLP Recommendation/Assessment: Patient needs continued Speech Language Pathology Services SLP Visit Diagnosis: Dysarthria and anarthria (R47.1)     Assistance Recommended at Discharge  Intermittent Supervision/Assistance for application of compensatory strategies.   Functional Status Assessment Patient has had a recent decline in their functional status and demonstrates the ability to make significant improvements in function in a reasonable and predictable amount of time.  Frequency and Duration min 2x/week  2 weeks      SLP Evaluation Cognition  Overall Cognitive Status: Within Functional Limits for tasks assessed Arousal/Alertness: Awake/alert Orientation Level: Oriented X4 Attention: Sustained Sustained Attention: Appears intact Memory: Appears intact (functional recall intact) Awareness: Appears intact Executive Function: Organizing Organizing: Appears intact Behaviors:  (none) Safety/Judgment: Appears intact       Comprehension  Auditory Comprehension Overall Auditory Comprehension: Appears within functional limits for tasks assessed Visual  Recognition/Discrimination Discrimination: Within Function Limits    Expression Verbal Expression Overall Verbal Expression: Appears within functional limits for tasks assessed   Oral / Motor  Oral Motor/Sensory Function  Overall Oral Motor/Sensory Function: Moderate impairment Facial ROM: Reduced left Facial Symmetry: Abnormal symmetry left Facial Strength: Reduced left Facial Sensation: Within Functional Limits Lingual ROM: Within Functional Limits Lingual Symmetry: Within Functional Limits Lingual Strength: Within Functional Limits Velum: Within Functional Limits Mandible: Within Functional Limits Motor Speech Overall Motor Speech: Impaired Respiration: Within functional limits Phonation: Normal Resonance: Within functional limits Articulation: Impaired Level of Impairment: Word Intelligibility: Intelligible (minimally reduced) Motor Planning: Within functional limits Motor Speech Errors: Aware Effective Techniques: Slow rate;Over-articulate;Increased vocal intensity;Pause           [name], MS, CCC-SLP Speech Language Pathologist Rehab Services; [facility] [phone] (ascom)   Ardell Aaronson J Clapp 03/14/2024, 11:00 AM

## 2024-03-14 NOTE — Care Management Obs Status (Signed)
 MEDICARE OBSERVATION STATUS NOTIFICATION   Patient Details  Name: Terrence Sanchez. MRN: [id number] Date of Birth: Dec 28, 1960   Medicare Observation Status Notification Given:  Yes    Delphine KANDICE Bring, RN 03/14/2024, 11:58 AM

## 2024-03-14 NOTE — Progress Notes (Signed)
°  Echocardiogram 2D Echocardiogram has been performed. Saline Microcavitation (Bubble Study) was requested and performed.  Terrence Sanchez 03/14/2024, 3:22 PM

## 2024-03-14 NOTE — TOC Initial Note (Signed)
 Transition of Care (TOC) - Initial/Assessment Note    Patient Details  Name: Terrence Sanchez. MRN: [id number] Date of Birth: [birth year]  Transition of Care [facility]) CM/SW Contact:    Delphine KANDICE Bring, RN Phone Number: 03/14/2024, 12:31 PM  Clinical Narrative:                 Father at bedside. Patient was initially OBS status when CM went to see patient. CM informed him that therapy had recommended SNF but due to his traditional Medicare he does not qualify for SNF. CM asked if he has be in the hospital in the last 30 days. Patient states no. Patient states he cannot move his left arm or leg.  Patient states that they did another MRI. CM explained that pending on clinicals and results of test his status may change.  CM asked has he been at [facility]. He states yes about  2 years ago. Cm asked which facility. Patient states [facility]. CM asked if he was in agreement  for a referral to [facility] if status change to inpatient and he has 3 midnight stay. Patient is in agreement.   Patient lives alone. He denies services in the home prior to admission. Patient states that he wears a boot on his right foot and use crutches at home but yesterday started using his walker.   Expected Discharge Plan: Skilled Nursing Facility     Patient Goals and CMS Choice            Expected Discharge Plan and Services       Living arrangements for the past 2 months: Single Family Home                                      Prior Living Arrangements/Services Living arrangements for the past 2 months: Single Family Home Lives with:: Self              Current home services: DME    Activities of Daily Living   ADL Screening (condition at time of admission) Independently performs ADLs?: Yes (appropriate for developmental age) Is the patient deaf or have difficulty hearing?: No Does the patient have difficulty seeing, even when wearing glasses/contacts?: No Does the patient have  difficulty concentrating, remembering, or making decisions?: No  Permission Sought/Granted                  Emotional Assessment Appearance:: Appears stated age   Affect (typically observed): Accepting Orientation: : Oriented to Self, Oriented to Place, Oriented to  Time, Oriented to Situation      Admission diagnosis:  Cerebral infarction [facility]) [I63.9] Patient Active Problem List   Diagnosis Date Noted   Cerebral infarction (HCC) 03/13/2024   ABLA (acute blood loss anemia) 02/13/2022   Overweight (BMI 25.0-29.9) 02/12/2022   Hypokalemia 02/12/2022   Intertrochanteric fracture of right femur, closed, initial encounter [facility]) 02/11/2022   Fall 02/11/2022   Hyperglycemia 02/11/2022   Transaminitis 02/11/2022   PCP:  Pcp, No Pharmacy:  No Pharmacies Listed    Social Drivers of Health (SDOH) Social History: SDOH Screenings   Food Insecurity: No Food Insecurity (03/13/2024)  Housing: Low Risk (03/13/2024)  Transportation Needs: No Transportation Needs (03/13/2024)  Utilities: Not At Risk (03/13/2024)  Social Connections: Socially Isolated (03/13/2024)  Tobacco Use: Low Risk (03/13/2024)   SDOH Interventions:     Readmission Risk Interventions  No data to display

## 2024-03-14 NOTE — Plan of Care (Signed)
°  Problem: Education: Goal: Knowledge of disease or condition will improve Outcome: Progressing Goal: Knowledge of secondary prevention will improve (MUST DOCUMENT ALL) Outcome: Progressing Goal: Knowledge of patient specific risk factors will improve (DELETE if not current risk factor) Outcome: Progressing   Problem: Coping: Goal: Will verbalize positive feelings about self Outcome: Not Progressing Goal: Will identify appropriate support needs Outcome: Not Progressing   Problem: Self-Care: Goal: Ability to participate in self-care as condition permits will improve Outcome: Not Progressing Goal: Verbalization of feelings and concerns over difficulty with self-care will improve Outcome: Not Progressing Goal: Ability to communicate needs accurately will improve Outcome: Not Progressing   Problem: Nutrition: Goal: Risk of aspiration will decrease Outcome: Progressing Goal: Dietary intake will improve Outcome: Progressing   Problem: Education: Goal: Knowledge of General Education information will improve Description: Including pain rating scale, medication(s)/side effects and non-pharmacologic comfort measures Outcome: Progressing

## 2024-03-14 NOTE — Hospital Course (Signed)
 Terrence Sanchez. is a 63 y.o. year old male without significant known medical hx coming in for evaluation of left sided weakness.      The weakness has been going on for about 2 days. He stated he had no plans of coming to the ED but his son advised him to seek care as he could be having a stroke. Pt denies any acute complaints currently. He reports very concerned about his functional status.    On arrival to the ED patient was noted to be HDS stable. Lab work and imaging obtained. CBC without leukocytosis, CMP with AGMA, moderate hyperglycemia, slight AST/ALT elevation and bilirubin elevation. VBG without any acidosis and lactic acid normal. CTH without any acute findings, but it showed pansinusitis. 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 Given concern for stroke and need for stroke work up, [facility] contacted for admission.  12/12: patient presented to the ED for chief concerns of left sided weakness.  12/12: patient admitted to the hospital with concerns of stroke/tia. MRI brain wo cm: acute right pontine infarct of intermediate size. Multifocal cerebral white matter hyperintensities consistent with chronic small vessel disease. Generalized cerebral volume loss.  12/13: neurology consulted and high intensity statin initiated. PT/OT evaluated patient. Echo on 03/14/24: LV EF estimated at 55-60%.  12/15: plan of care by Neurology: Continue asa 81/plavix  75 mg daily to complete 21 day course, then change to aspirin  81 mg as monotherapy; atorvasatin 40 mg daily, and Dr. Zollie states she will arrange outpatient neurology follow-up.   12/16: I assumed care of the patient. Bed available at [facility] and patient/family accepted the bed. Pt reports his family has been updated. At bedside, he denies chest pain, shortness of breath, new pain, abdominal pain, dysuria. He reports chronic right shoulder  pain for more > 30 years that is unchanged.

## 2024-03-14 NOTE — Progress Notes (Signed)
 OT Cancellation Note  Patient Details Name: Terrence Sanchez. MRN: [id number] DOB: 11-07-1960   Cancelled Treatment:    Reason Eval/Treat Not Completed: Patient at procedure or test/ unavailable. Pt currently with MD. OT will check back as able.   Edie Darley L. Shyia Fillingim, OTR/L  03/14/2024, 9:39 AM

## 2024-03-14 NOTE — Consult Note (Signed)
 Reason for Consult:Acute infarct Requesting Physician: Paudel  CC: Left sided weakness  I have been asked by Dr. Roann to see this patient in consultation for acute infarct.  HPI: Terrence Sanchez. is an 63 y.o. male with a history of DM and multiple RLE surgeries leading to weakness in the RLE who presents with complaints of left sided weakness.  Patient reports that on 12/10 at 2000 noted left sided facial droop, slurred speech and decreased sensation on the LUE and LLE.  Also developed left sided weakness as well.  With no improvement presented for evaluation.  Reports that since admission has had worsening speech and left upper extremity weakness.    LKW: 03/11/2024 @ 2000 TNK candidate: No, outside time window Thrombectomy candidate: No, outside time window mRankin: 1  Past Medical History:  Diagnosis Date   Diabetes mellitus without complication (HCC)     Past Surgical History:  Procedure Laterality Date   FRACTURE SURGERY     INTRAMEDULLARY (IM) NAIL INTERTROCHANTERIC Right 02/12/2022   Procedure: INTRAMEDULLARY (IM) NAIL INTERTROCHANTERIC;  Surgeon: Edie Norleen PARAS, MD;  Location: [facility] ORS;  Service: Orthopedics;  Laterality: Right;    History reviewed. No pertinent family history.  Social History:  reports that he has never smoked. He has never used smokeless tobacco. He reports current alcohol use. No history on file for drug use.  Allergies[1]  Medications: I have reviewed the patient's current medications. Prior to Admission:  Medications Prior to Admission  Medication Sig Dispense Refill Last Dose/Taking   acetaminophen  (TYLENOL ) 325 MG tablet Take 650 mg by mouth every 4 (four) hours as needed for moderate pain (pain score 4-6) or mild pain (pain score 1-3).   Unknown   Multiple Vitamin (MULTIVITAMIN WITH MINERALS) TABS tablet Take 1 tablet by mouth daily. 30 tablet 0 Past Week   amLODipine  (NORVASC ) 5 MG tablet Take 1 tablet (5 mg total) by mouth daily.  (Patient not taking: Reported on 03/13/2024) 30 tablet 0 Not Taking   bisacodyl  (DULCOLAX) 10 MG suppository Place 1 suppository (10 mg total) rectally daily as needed for moderate constipation. (Patient not taking: Reported on 03/13/2024) 12 suppository 0 Not Taking   docusate sodium  (COLACE) 100 MG capsule Take 1 capsule (100 mg total) by mouth 2 (two) times daily. (Patient not taking: Reported on 03/13/2024) 10 capsule 0 Not Taking   enoxaparin  (LOVENOX ) 40 MG/0.4ML injection Inject 0.4 mLs (40 mg total) into the skin daily. (Patient not taking: Reported on 03/13/2024) 5.6 mL 0 Not Taking   methocarbamol  (ROBAXIN ) 500 MG tablet Take 1 tablet (500 mg total) by mouth every 6 (six) hours as needed for muscle spasms. (Patient not taking: Reported on 03/13/2024) 20 tablet 0 Not Taking   oxyCODONE  (OXY IR/ROXICODONE ) 5 MG immediate release tablet Take 1-2 tablets (5-10 mg total) by mouth every 4 (four) hours as needed for moderate pain (pain score 4-6). (Patient not taking: Reported on 03/13/2024) 30 tablet 0 Not Taking   Vitamins-Lipotropics (MEGA MULTIPLE/CHELATED MINERAL) TABS Take 1 tablet by mouth daily. (Patient not taking: Reported on 03/13/2024)   Not Taking   Scheduled:  folic acid   1 mg Oral Daily   heparin   5,000 Units Subcutaneous Q8H   hydrocerin   Topical BID   insulin  aspart  0-9 Units Subcutaneous TID WC   multivitamin with minerals  1 tablet Oral Daily   sodium chloride  flush  3 mL Intravenous Q12H   thiamine   100 mg Oral Daily   Or  thiamine   100 mg Intravenous Daily    ROS: History obtained from the patient  General ROS: negative for - chills, fatigue, fever, night sweats, weight gain or weight loss Psychological ROS: negative for - behavioral disorder, hallucinations, memory difficulties, mood swings or suicidal ideation Ophthalmic ROS: negative for - blurry vision, double vision, eye pain or loss of vision ENT ROS: negative for - epistaxis, nasal discharge, oral lesions,  sore throat, tinnitus or vertigo Allergy and Immunology ROS: negative for - hives or itchy/watery eyes Hematological and Lymphatic ROS: negative for - bleeding problems, bruising or swollen lymph nodes Endocrine ROS: negative for - galactorrhea, hair pattern changes, polydipsia/polyuria or temperature intolerance Respiratory ROS: negative for - cough, hemoptysis, shortness of breath or wheezing Cardiovascular ROS: negative for - chest pain, dyspnea on exertion, edema or irregular heartbeat Gastrointestinal ROS: negative for - abdominal pain, diarrhea, hematemesis, nausea/vomiting or stool incontinence Genito-Urinary ROS: negative for - dysuria, hematuria, incontinence or urinary frequency/urgency Musculoskeletal ROS: negative for - joint swelling or muscular weakness Neurological ROS: as noted in HPI Dermatological ROS: negative for rash and skin lesion changes   Physical Examination: Blood pressure (!) 151/94, pulse 73, temperature 98.4 F (36.9 C), temperature source Oral, resp. rate 16, height 6' (1.829 m), weight 93.3 kg, SpO2 98%.  HEENT-  Normocephalic, no lesions, without obvious abnormality.  Normal external eye and conjunctiva.  Normal external ears. Normal external nose, mucus membranes and septum. Cardiovascular- Single S1, S2 Lungs- CTA Abdomen- soft, non-tender; bowel sounds normal; no masses,  no organomegaly Extremities- Right foot joint deformity Musculoskeletal-No joint tenderness Skin-Skin changes in the feet bilaterally, right greater than left  Neurological Examination   Mental Status: Alert, oriented, thought content appropriate.  Speech fluent without evidence of aphasia.  Able to follow 3 step commands without difficulty.  Dysarthric. Cranial Nerves: II: Visual fields grossly normal, pupils equal, round, reactive to light and accommodation III,IV, VI: ptosis not present, extra-ocular motions intact bilaterally V,VII: left facial droop, facial light touch sensation  normal bilaterally VIII: hearing normal bilaterally XI: bilateral shoulder shrug XII: midline tongue extension Motor: Right : Upper extremity   5/5    Left:     Upper extremity   0/5  Lower extremity   5/5     Lower extremity   Trace movement in the LLE Sensory: Pinprick and light touch intact throughout, bilaterally Deep Tendon Reflexes: Symmetric throughout Plantars: Right: mute   Left: upgoing Cerebellar: normal finger-to-nose, normal rapid alternating movements and normal heel-to-shin test Gait: not tested due to safety concerns    Laboratory Studies:   Basic Metabolic Panel: Recent Labs  Lab 03/13/24 1452 03/13/24 2027 03/14/24 0502  NA 136  --  136  K 4.1  --  3.5  CL 96*  --  99  CO2 20*  --  23  GLUCOSE 153*  --  128*  BUN 15  --  15  CREATININE 0.95  --  0.84  CALCIUM  9.4  --  9.5  MG  --  2.0  --     Liver Function Tests: Recent Labs  Lab 03/13/24 1452  AST 60*  ALT 63*  ALKPHOS 75  BILITOT 1.7*  PROT 7.2  ALBUMIN 4.6   No results for input(s): LIPASE, AMYLASE in the last 168 hours. No results for input(s): AMMONIA in the last 168 hours.  CBC: Recent Labs  Lab 03/13/24 1452 03/14/24 0502  WBC 7.6 5.3  NEUTROABS 6.0  --   HGB 15.4 14.7  HCT  43.3 41.9  MCV 95.6 96.3  PLT 159 155    Cardiac Enzymes: No results for input(s): CKTOTAL, CKMB, CKMBINDEX, TROPONINI in the last 168 hours.  BNP: Invalid input(s): POCBNP  CBG: Recent Labs  Lab 03/13/24 1456 03/13/24 2228 03/14/24 0750  GLUCAP 160* 170* 144*    Microbiology: Results for orders placed or performed during the hospital encounter of 02/11/22  Surgical PCR screen     Status: None   Collection Time: 02/12/22  6:53 AM   Specimen: Nasal Mucosa; Nasal Swab  Result Value Ref Range Status   MRSA, PCR NEGATIVE NEGATIVE Final   Staphylococcus aureus NEGATIVE NEGATIVE Final    Comment: (NOTE) The Xpert SA Assay (FDA approved for NASAL specimens in patients 22 years  of age and older), is one component of a comprehensive surveillance program. It is not intended to diagnose infection nor to guide or monitor treatment. Performed at [facility], [address] [place], [place] [address]     Coagulation Studies: Recent Labs    03/13/24 1452  LABPROT 14.5  INR 1.1    Urinalysis: No results for input(s): COLORURINE, LABSPEC, PHURINE, GLUCOSEU, HGBUR, BILIRUBINUR, KETONESUR, PROTEINUR, UROBILINOGEN, NITRITE, LEUKOCYTESUR in the last 168 hours.  Invalid input(s): APPERANCEUR  Lipid Panel:     Component Value Date/Time   CHOL 149 03/13/2024 2027   TRIG 81 03/13/2024 2027   HDL 51 03/13/2024 2027   CHOLHDL 2.9 03/13/2024 2027   VLDL 16 03/13/2024 2027   LDLCALC 82 03/13/2024 2027    HgbA1C:  Lab Results  Component Value Date   HGBA1C 5.3 03/13/2024    Urine Drug Screen:  No results found for: LABOPIA, COCAINSCRNUR, LABBENZ, AMPHETMU, THCU, LABBARB  Alcohol Level: No results for input(s): ETH in the last 168 hours.  Other results: EKG: sinus arrhythmia at 97 bpm, RBB, LAFB  Imaging: MR BRAIN WO CONTRAST Result Date: 03/13/2024 EXAM: MRI BRAIN WITHOUT CONTRAST 03/13/2024 09:02:44 PM TECHNIQUE: Multiplanar multisequence MRI of the head/brain was performed without the administration of intravenous contrast. COMPARISON: None available. CLINICAL HISTORY: Neuro deficit, acute, stroke suspected. FINDINGS: BRAIN AND VENTRICLES: Acute infarct within the right pons of intermediate size. No other area of acute ischemia. Multifocal hyperintense T2-weighted signal within the cerebral white matter, most commonly due to chronic small vessel disease. Generalized volume loss. 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 No intracranial hemorrhage, mass, midline shift, or hydrocephalus. The sella is unremarkable. Normal flow voids. ORBITS: No acute abnormality. SINUSES AND MASTOIDS: Right maxillary and left sphenoid  sinus mucosal thickening and opacification of the frontal sinus. BONES AND SOFT TISSUES: Normal marrow signal. No acute soft tissue abnormality. IMPRESSION: 1. 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 2. Multifocal cerebral white matter T2 hyperintensities consistent with chronic small vessel disease. 3. Generalized cerebral volume loss. Electronically signed by: Franky Stanford MD 03/13/2024 09:07 PM EST RP Workstation: [id number]   CT ANGIO HEAD NECK W WO CM Result Date: 03/13/2024 EXAM: CTA HEAD AND NECK WITHOUT AND WITH 03/13/2024 04:45:36 PM TECHNIQUE: CTA of the head and neck was performed without and with the administration of 75 mL of iohexol  (OMNIPAQUE ) 350 MG/ML injection. Multiplanar 2D and/or 3D reformatted images are provided for review. Automated exposure control, iterative reconstruction, and/or weight based adjustment of the mA/kV was utilized to reduce the radiation dose to as low as reasonably achievable. Stenosis of the internal carotid arteries measured using NASCET criteria. COMPARISON: None available CLINICAL HISTORY: Neuro deficit, acute, stroke suspected. New onset of left sided facial  droop, slurred speech, and left leg numbness beginning 2 days ago. FINDINGS: CTA NECK: AORTIC ARCH AND ARCH VESSELS: No dissection or arterial injury. No significant stenosis of the brachiocephalic or subclavian arteries. CERVICAL CAROTID ARTERIES: No dissection or arterial injury. Small to moderate amount of calcified greater than soft plaque in the distal common and proximal internal carotid arteries on the right and a smaller amount of calcified plaque on the left. No hemodynamically significant stenosis by NASCET criteria. CERVICAL VERTEBRAL ARTERIES: Dominant left vertebral artery. Calcified plaque at the origin of the right vertebral artery results in moderate to severe stenosis. No dissection or arterial injury. LUNGS AND MEDIASTINUM: Unremarkable. SOFT TISSUES: No acute abnormality. BONES:  Numerous dental caries. Interbody and facet ankylosis at C4-C5 and C5-C6. Asymmetrically severe left facet arthrosis at C2-C3 and C3-C4. Multilevel bridging anterior vertebral osteophytes in the cervical and upper thoracic spine. CTA HEAD: ANTERIOR CIRCULATION: The intracranial internal carotid arteries are patent with calcified plaque resulting in mild to moderate proximal left supraclinoid stenosis and no significant stenosis on the right. ACAs and MCAs are patent without evidence of a proximal branch occlusion or significant proximal stenosis. No aneurysm. POSTERIOR CIRCULATION: The intracranial vertebral arteries are patent with calcified plaque resulting in mild stenosis on the left. Patent PICA and SCA origins are visualized bilaterally. The basilar artery is widely patent. Posterior communicating arteries are diminutive or absent. Both PCAs are patent without evidence of a significant proximal stenosis. Posterior communicating arteries are diminutive or absent. No aneurysm. OTHER: No dural venous sinus thrombosis on this non-dedicated study. IMPRESSION: 1. No large vessel occlusion. 2. 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 3. 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 Electronically signed by: Dasie Hamburg MD 03/13/2024 05:09 PM EST RP Workstation: [id number]   CT HEAD WO CONTRAST Result Date: 03/13/2024 EXAM: CT HEAD WITHOUT 03/13/2024 03:36:00 PM TECHNIQUE: CT of the head was performed without the administration of intravenous contrast. Automated exposure control, iterative reconstruction, and/or weight based adjustment of the mA/kV was utilized to reduce the radiation dose to as low as reasonably achievable. COMPARISON: None available. CLINICAL HISTORY: Neuro deficit, acute, stroke suspected. Left sided weakness. Altered mental status. FINDINGS: BRAIN AND VENTRICLES: There is no evidence of an acute infarct, intracranial hemorrhage, mass, midline  shift, hydrocephalus, or extra-axial fluid collection. There is mild cerebral atrophy. Cerebral white matter hypodensities are nonspecific but compatible with mild chronic small vessel ischemic disease. Calcified atherosclerosis at the skull base. ORBITS: No acute abnormality. SINUSES AND MASTOIDS: Complete opacification of the frontal sinus with surrounding osteitis consistent with chronic sinusitis. Moderate bilateral ethmoid air cell opacification. Prominent circumferential mucosal thickening in the right maxillary sinus. Large fluid level in the left sphenoid sinus. Clear mastoid air cells. SOFT TISSUES AND SKULL: No acute skull fracture. No acute soft tissue abnormality. IMPRESSION: 1. No acute intracranial abnormality. 2. Mild chronic small vessel ischemic disease. 3. Pansinusitis. Electronically signed by: Dasie Hamburg MD 03/13/2024 03:51 PM EST RP Workstation: [id number]     Assessment/Plan: 63 y.o. male with a history of DM and multiple RLE surgeries leading to weakness in the RLE who presents with complaints of left sided weakness.  Has had some worsening since admission.  MRI of the brain personally reviewed and reveals an acute right pontine infarct.  CTA of the head and neck reveals personally reviewed and reveals moderate to severe stenosis of the right vertebral artery.  Etiology of infarct likely small vessel disease.  A1c 5.3,  LDL 82.  Echocardiogram pending  Recommendations: 1. Would repeat head CT with worsening of symptoms to rule out hemorrhage into infarct. 2. PT consult, OT consult, Speech consult 3. Echocardiogram pending 4. Prophylactic therapy-Dual antiplatelet therapy with ASA 81mg  and Plavix  75mg  for three weeks with change to ASA 81mg  alone as monotherapy after that time.  5. Telemetry monitoring 6. Frequent neuro checks 7. High intensity statin initiation   Case discussed with Dr. Roann Sonny Hock, MD Neurology  03/14/2024, 10:14 AM          [1] No  Known Allergies

## 2024-03-14 NOTE — Progress Notes (Signed)
 Progress Note   Patient: Terrence Sanchez. FMW:[id number] DOB: [birth year] DOA: 03/13/2024     0 DOS: the patient was seen and examined on 03/14/2024   Brief hospital course: HPI:    Terrence Sanchez. is a 63 y.o. year old male without significant known medical hx coming in for evaluation of left sided weakness.      The weakness has been going on for about 2 days. He stated he had no plans of coming to the ED but his son advised him to seek care as he could be having a stroke. Pt denies any acute complaints currently. He reports very concerned about his functional status.      On arrival to the ED patient was noted to be HDS stable. Lab work and imaging obtained. CBC without leukocytosis, CMP with AGMA, moderate hyperglycemia, slight AST/ALT elevation and bilirubin elevation. VBG without any acidosis and lactic acid normal. CTH without any acute findings, but it showed pansinusitis. 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 Given concern for stroke and need for stroke work up, [facility] contacted for admission.  Assessment and Plan: 63 year old male double/PMH of diabetes, HTN, right ankle fracture on chronic boot requiring bilateral crutches for 10 years after he had a right ankle fracture which was fused was admitted from emergency room for acute stroke.  1.  Acute ischemic stroke with left-sided hemiparesis - Patient has been admitted as inpatient for acute ischemic stroke - He is LDL was 82, A1c was 5.3, CTA of the head and neck reveals moderate to severe stenosis on the right vertebral artery. - MRI of the brain showed acute right pontine infarct. - Neurology consult appreciated - Patient to continue PT/OT/ST/echocardiogram - Neurologist advised for aspirin , Plavix  for 3 weeks with change to aspirin  81 daily. -Started on statin Lipitor 40 mg at bedtime as his LDL is already around  goal - Continue telemetry - 12/13: Repeat CT scan this morning did not show any hemorrhagic conversion  2.  HTN - Permissive hypertension - Continue to monitor blood pressure  3.  Type 2 diabetes - A1c is normal - Continue sliding scale  4.  Right ankle fusion requiring crutches for the last 10 years - Continue PT/OT - Continue supportive care      Subjective: Left upper extremity weakness worsened than yesterday  Physical Exam: Vitals:   03/14/24 0416 03/14/24 0500 03/14/24 0753 03/14/24 1154  BP: (!) 158/103  (!) 151/94 (!) 170/88  Pulse: 93  73 80  Resp: 16  16 18   Temp: 97.6 F (36.4 C)  98.4 F (36.9 C) 98.5 F (36.9 C)  TempSrc:   Oral Oral  SpO2: 98%  98% 97%  Weight:  93.3 kg    Height:       Constitutional: Alert, awake, calm, comfortable HEENT: Neck supple Respiratory: Clear to auscultation B/L, no wheezing, no rales.  Cardiovascular: Regular rate and rhythm, no murmurs / rubs / gallops. No extremity edema. 2+ pedal pulses. No carotid bruits.  Abdomen: Soft, no tenderness, Bowel sounds positive.  Musculoskeletal: Left upper extremity has flaccid paralysis, LLE is weak.  Skin: no rashes, lesions, ulcers. Neurologic: CN 2-12 grossly intact. Sensation intact, No focal deficit identified Psychiatric: Alert and oriented x 3. Normal mood.    Data Reviewed:  Reviewed  Family Communication: None  Disposition: Status is: Inpatient Remains inpatient appropriate because: Ongoing evaluation, recovery from stroke  Planned Discharge Destination: Skilled nursing facility    Time spent: 35 minutes  Author: Nena Rebel, MD 03/14/2024 2:01 PM  For on call review [email or web address].

## 2024-03-14 NOTE — NC FL2 (Signed)
 [place]  MEDICAID [id number] LEVEL OF CARE FORM     IDENTIFICATION  Patient Name: Terrence Sanchez. Birthdate: 02/13/1961 Sex: male Admission Date (Current Location): 03/13/2024  [place] and [place] Number:  Chiropodist and Address:  [facility], [address], [place], [place] [address]      Provider Number: [id number]  Attending Physician Name and Address:  Roann Gouty, MD  Relative Name and Phone Number:  Jafar, Poffenberger  [phone]    Current Level of Care: Hospital Recommended Level of Care: Skilled Nursing Facility Prior Approval Number:    Date Approved/Denied:   PASRR Number: [id number]  Discharge Plan: SNF    Current Diagnoses: Patient Active Problem List   Diagnosis Date Noted   Cerebral infarction (HCC) 03/13/2024   ABLA (acute blood loss anemia) 02/13/2022   Overweight (BMI 25.0-29.9) 02/12/2022   Hypokalemia 02/12/2022   Intertrochanteric fracture of right femur, closed, initial encounter (HCC) 02/11/2022   Fall 02/11/2022   Hyperglycemia 02/11/2022   Transaminitis 02/11/2022    Orientation RESPIRATION BLADDER Height & Weight     Self, Time, Situation  Normal Continent Weight: 93.3 kg Height:  6' (182.9 cm)  BEHAVIORAL SYMPTOMS/MOOD NEUROLOGICAL BOWEL NUTRITION STATUS      Continent Diet  AMBULATORY STATUS COMMUNICATION OF NEEDS Skin   Extensive Assist Verbally                         Personal Care Assistance Level of Assistance  Bathing, Feeding, Dressing   Feeding assistance: Limited assistance Dressing Assistance: Limited assistance     Functional Limitations Info  Sight, Hearing, Speech Sight Info: Adequate Hearing Info: Adequate Speech Info: Adequate    SPECIAL CARE FACTORS FREQUENCY  PT (By licensed PT), OT (By licensed OT)     PT Frequency: 5x/week OT Frequency: 5x/week            Contractures Contractures Info: Not present    Additional Factors Info  Code Status, Allergies  Code Status Info: Full Code Allergies Info: No Know Allergies           Current Medications (03/14/2024):  This is the current hospital active medication list Current Facility-Administered Medications  Medication Dose Route Frequency Provider Last Rate Last Admin   acetaminophen  (TYLENOL ) tablet 650 mg  650 mg Oral Q6H PRN Khan, Ghalib, MD   650 mg at 03/13/24 2147   Or   acetaminophen  (TYLENOL ) suppository 650 mg  650 mg Rectal Q6H PRN Fernand Prost, MD       folic acid  (FOLVITE ) tablet 1 mg  1 mg Oral Daily Bradler, Evan K, MD   1 mg at 03/14/24 0756   heparin  injection 5,000 Units  5,000 Units Subcutaneous Q8H Khan, Ghalib, MD   5,000 Units at 03/14/24 9487   hydrocerin (EUCERIN) cream   Topical BID Paudel, Keshab, MD       insulin  aspart (novoLOG ) injection 0-9 Units  0-9 Units Subcutaneous TID WC Khan, Ghalib, MD   1 Units at 03/14/24 1207   LORazepam  (ATIVAN ) tablet 1-4 mg  1-4 mg Oral Q1H PRN Bradler, Evan K, MD       Or   LORazepam  (ATIVAN ) injection 1-4 mg  1-4 mg Intravenous Q1H PRN Bradler, Evan K, MD       multivitamin with minerals tablet 1 tablet  1 tablet Oral Daily Bradler, Evan K, MD   1 tablet at 03/14/24 0756   ondansetron  (ZOFRAN ) tablet 4 mg  4 mg Oral Q6H PRN Fernand Prost, MD       Or   ondansetron  (ZOFRAN ) injection 4 mg  4 mg Intravenous Q6H PRN Khan, Ghalib, MD       senna-docusate (Senokot-S) tablet 1 tablet  1 tablet Oral QHS PRN Fernand Prost, MD       sodium chloride  flush (NS) 0.9 % injection 3 mL  3 mL Intravenous Q12H Khan, Ghalib, MD   3 mL at 03/14/24 0756   thiamine  (VITAMIN B1) tablet 100 mg  100 mg Oral Daily Bradler, Evan K, MD   100 mg at 03/14/24 9244   Or   thiamine  (VITAMIN B1) injection 100 mg  100 mg Intravenous Daily Bradler, Evan K, MD   100 mg at 03/13/24 1808   traZODone  (DESYREL ) tablet 25 mg  25 mg Oral QHS PRN Mansy, Jan A, MD   25 mg at 03/13/24 2147     Discharge Medications: Please see discharge summary for a list of discharge  medications.  Relevant Imaging Results:  Relevant Lab Results:   Additional Information SSN [id number]  Delphine KANDICE Bring, RN

## 2024-03-15 LAB — ECHOCARDIOGRAM COMPLETE BUBBLE STUDY
Area-P 1/2: 3.21 cm2
S' Lateral: 3.5 cm

## 2024-03-15 LAB — GLUCOSE, CAPILLARY
Glucose-Capillary: 184 mg/dL — ABNORMAL HIGH (ref 70–99)
Glucose-Capillary: 136 mg/dL — ABNORMAL HIGH (ref 70–99)
Glucose-Capillary: 172 mg/dL — ABNORMAL HIGH (ref 70–99)
Glucose-Capillary: 147 mg/dL — ABNORMAL HIGH (ref 70–99)

## 2024-03-15 NOTE — Progress Notes (Signed)
 Subjective: Improved movement in the LLE.  LUE remains flaccid   Objective: Current vital signs: BP (!) 143/101 (BP Location: Right Arm)   Pulse 71   Temp 98.1 F (36.7 C)   Resp 16   Ht 6' (1.829 m)   Wt 95.2 kg   SpO2 97%   BMI 28.46 kg/m  Vital signs in last 24 hours: Temp:  [97.3 F (36.3 C)-98.5 F (36.9 C)] 98.1 F (36.7 C) (12/14 0720) Pulse Rate:  [63-86] 71 (12/14 0720) Resp:  [16-18] 16 (12/14 0720) BP: (100-187)/(70-101) 143/101 (12/14 0720) SpO2:  [96 %-100 %] 97 % (12/14 0720) Weight:  [95.2 kg] 95.2 kg (12/14 0440)  Intake/Output from previous day: 12/13 0701 - 12/14 0700 In: -  Out: 300 [Urine:300] Intake/Output this shift: No intake/output data recorded. Nutritional status:  Diet Order             Diet heart healthy/carb modified Room service appropriate? Yes; Fluid consistency: Thin  Diet effective now                   Neurologic Exam: Mental Status: Alert, oriented, thought content appropriate.  Speech fluent without evidence of aphasia.  Able to follow 3 step commands without difficulty.  Dysarthric. Cranial Nerves: II: Visual fields grossly normal, pupils equal, round, reactive to light and accommodation III,IV, VI: ptosis not present, extra-ocular motions intact bilaterally V,VII: left facial droop, facial light touch sensation normal bilaterally VIII: hearing normal bilaterally XI: bilateral shoulder shrug XII: midline tongue extension Motor: Right :  Upper extremity   5/5                                      Left:     Upper extremity   0/5             Lower extremity   5/5                                                  Lower extremity   Able to lift off bed about 6 inches Sensory: Pinprick and light touch intact throughout, bilaterally Deep Tendon Reflexes: Symmetric throughout Plantars: Right: mute                              Left: upgoing Cerebellar: normal finger-to-nose, normal rapid alternating movements and normal  heel-to-shin test Gait: not tested due to safety concerns  Lab Results: Basic Metabolic Panel: Recent Labs  Lab 03/13/24 1452 03/13/24 2027 03/14/24 0502  NA 136  --  136  K 4.1  --  3.5  CL 96*  --  99  CO2 20*  --  23  GLUCOSE 153*  --  128*  BUN 15  --  15  CREATININE 0.95  --  0.84  CALCIUM  9.4  --  9.5  MG  --  2.0  --     Liver Function Tests: Recent Labs  Lab 03/13/24 1452  AST 60*  ALT 63*  ALKPHOS 75  BILITOT 1.7*  PROT 7.2  ALBUMIN 4.6   No results for input(s): LIPASE, AMYLASE in the last 168 hours. No results for input(s): AMMONIA in the last 168 hours.  CBC: Recent Labs  Lab 03/13/24 1452 03/14/24 0502  WBC 7.6 5.3  NEUTROABS 6.0  --   HGB 15.4 14.7  HCT 43.3 41.9  MCV 95.6 96.3  PLT 159 155    Cardiac Enzymes: No results for input(s): CKTOTAL, CKMB, CKMBINDEX, TROPONINI in the last 168 hours.  Lipid Panel: Recent Labs  Lab 03/13/24 2027  CHOL 149  TRIG 81  HDL 51  CHOLHDL 2.9  VLDL 16  LDLCALC 82    CBG: Recent Labs  Lab 03/13/24 2228 03/14/24 0750 03/14/24 1202 03/14/24 1650 03/14/24 1959  GLUCAP 170* 144* 145* 140* 138*    Microbiology: Results for orders placed or performed during the hospital encounter of 02/11/22  Surgical PCR screen     Status: None   Collection Time: 02/12/22  6:53 AM   Specimen: Nasal Mucosa; Nasal Swab  Result Value Ref Range Status   MRSA, PCR NEGATIVE NEGATIVE Final   Staphylococcus aureus NEGATIVE NEGATIVE Final    Comment: (NOTE) The Xpert SA Assay (FDA approved for NASAL specimens in patients 75 years of age and older), is one component of a comprehensive surveillance program. It is not intended to diagnose infection nor to guide or monitor treatment. Performed at [facility], [address] [place], [place] [address]     Coagulation Studies: Recent Labs    03/13/24 1452  LABPROT 14.5  INR 1.1    Imaging: CT HEAD WO CONTRAST ( ) Result Date:  03/14/2024 EXAM: CT HEAD WITHOUT 03/14/2024 11:12:52 AM TECHNIQUE: CT of the head was performed without the administration of intravenous contrast. Automated exposure control, iterative reconstruction, and/or weight based adjustment of the mA/kV was utilized to reduce the radiation dose to as low as reasonably achievable. COMPARISON: Head CT and MRI 03/13/2024. CLINICAL HISTORY: Neurological deficit, acute, stroke suspected. Facial droop. FINDINGS: BRAIN AND VENTRICLES: Hypodensity in the right paracentral pons corresponds to the acute infarct on MRI. No acute supratentorial infarct, intracranial hemorrhage, mass, midline shift, hydrocephalus, or extra-axial fluid collection is identified. A small chronic left cerebellar infarct is again noted. Patchy hypodensities in the cerebral white matter bilaterally are nonspecific but compatible with mild to moderate chronic small vessel ischemic disease. There is mild cerebral atrophy. Calcified atherosclerosis at the skull base. ORBITS: No acute abnormality. SINUSES AND MASTOIDS: Chronic sinusitis including persistent complete opacification of the frontal sinus and extensive mucosal thickening in the right maxillary sinus. Unchanged left sphenoid sinus fluid. Clear mastoid air cells. SOFT TISSUES AND SKULL: No acute skull fracture. No acute soft tissue abnormality. IMPRESSION: 1. Known acute right pontine infarct. 2. Mild to moderate chronic small vessel ischemic disease. Electronically signed by: Dasie Hamburg MD 03/14/2024 11:35 AM EST RP Workstation: [id number]   MR BRAIN WO CONTRAST Result Date: 03/13/2024 EXAM: MRI BRAIN WITHOUT CONTRAST 03/13/2024 09:02:44 PM TECHNIQUE: Multiplanar multisequence MRI of the head/brain was performed without the administration of intravenous contrast. COMPARISON: None available. CLINICAL HISTORY: Neuro deficit, acute, stroke suspected. FINDINGS: BRAIN AND VENTRICLES: Acute infarct within the right pons of intermediate size. No other  area of acute ischemia. Multifocal hyperintense T2-weighted signal within the cerebral white matter, most commonly due to chronic small vessel disease. Generalized volume loss. 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 No intracranial hemorrhage, mass, midline shift, or hydrocephalus. The sella is unremarkable. Normal flow voids. ORBITS: No acute abnormality. SINUSES AND MASTOIDS: Right maxillary and left sphenoid sinus mucosal thickening and opacification of the frontal sinus. BONES AND SOFT TISSUES: Normal marrow signal. No acute soft tissue abnormality. IMPRESSION: 1. Acute right  pontine infarct of intermediate size. 2. Multifocal cerebral white matter T2 hyperintensities consistent with chronic small vessel disease. 3. Generalized cerebral volume loss. Electronically signed by: Franky Stanford MD 03/13/2024 09:07 PM EST RP Workstation: [id number]   CT ANGIO HEAD NECK W WO CM Result Date: 03/13/2024 EXAM: CTA HEAD AND NECK WITHOUT AND WITH 03/13/2024 04:45:36 PM TECHNIQUE: CTA of the head and neck was performed without and with the administration of 75 mL of iohexol  (OMNIPAQUE ) 350 MG/ML injection. Multiplanar 2D and/or 3D reformatted images are provided for review. Automated exposure control, iterative reconstruction, and/or weight based adjustment of the mA/kV was utilized to reduce the radiation dose to as low as reasonably achievable. Stenosis of the internal carotid arteries measured using NASCET criteria. COMPARISON: None available CLINICAL HISTORY: Neuro deficit, acute, stroke suspected. New onset of left sided facial droop, slurred speech, and left leg numbness beginning 2 days ago. FINDINGS: CTA NECK: AORTIC ARCH AND ARCH VESSELS: No dissection or arterial injury. No significant stenosis of the brachiocephalic or subclavian arteries. CERVICAL CAROTID ARTERIES: No dissection or arterial injury. Small to moderate amount of calcified greater than soft plaque in the distal common and proximal internal  carotid arteries on the right and a smaller amount of calcified plaque on the left. No hemodynamically significant stenosis by NASCET criteria. CERVICAL VERTEBRAL ARTERIES: Dominant left vertebral artery. Calcified plaque at the origin of the right vertebral artery results in moderate to severe stenosis. No dissection or arterial injury. LUNGS AND MEDIASTINUM: Unremarkable. SOFT TISSUES: No acute abnormality. BONES: Numerous dental caries. Interbody and facet ankylosis at C4-C5 and C5-C6. Asymmetrically severe left facet arthrosis at C2-C3 and C3-C4. Multilevel bridging anterior vertebral osteophytes in the cervical and upper thoracic spine. CTA HEAD: ANTERIOR CIRCULATION: The intracranial internal carotid arteries are patent with calcified plaque resulting in mild to moderate proximal left supraclinoid stenosis and no significant stenosis on the right. ACAs and MCAs are patent without evidence of a proximal branch occlusion or significant proximal stenosis. No aneurysm. POSTERIOR CIRCULATION: The intracranial vertebral arteries are patent with calcified plaque resulting in mild stenosis on the left. Patent PICA and SCA origins are visualized bilaterally. The basilar artery is widely patent. Posterior communicating arteries are diminutive or absent. Both PCAs are patent without evidence of a significant proximal stenosis. Posterior communicating arteries are diminutive or absent. No aneurysm. OTHER: No dural venous sinus thrombosis on this non-dedicated study. IMPRESSION: 1. No large vessel occlusion. 2. 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 3. 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 Electronically signed by: Dasie Hamburg MD 03/13/2024 05:09 PM EST RP Workstation: [id number]   CT HEAD WO CONTRAST Result Date: 03/13/2024 EXAM: CT HEAD WITHOUT 03/13/2024 03:36:00 PM TECHNIQUE: CT of the head was performed without the administration of intravenous contrast.  Automated exposure control, iterative reconstruction, and/or weight based adjustment of the mA/kV was utilized to reduce the radiation dose to as low as reasonably achievable. COMPARISON: None available. CLINICAL HISTORY: Neuro deficit, acute, stroke suspected. Left sided weakness. Altered mental status. FINDINGS: BRAIN AND VENTRICLES: There is no evidence of an acute infarct, intracranial hemorrhage, mass, midline shift, hydrocephalus, or extra-axial fluid collection. There is mild cerebral atrophy. Cerebral white matter hypodensities are nonspecific but compatible with mild chronic small vessel ischemic disease. Calcified atherosclerosis at the skull base. ORBITS: No acute abnormality. SINUSES AND MASTOIDS: Complete opacification of the frontal sinus with surrounding osteitis consistent with chronic sinusitis. Moderate bilateral ethmoid air cell opacification. Prominent circumferential mucosal  thickening in the right maxillary sinus. Large fluid level in the left sphenoid sinus. Clear mastoid air cells. SOFT TISSUES AND SKULL: No acute skull fracture. No acute soft tissue abnormality. IMPRESSION: 1. No acute intracranial abnormality. 2. Mild chronic small vessel ischemic disease. 3. Pansinusitis. Electronically signed by: Dasie Hamburg MD 03/13/2024 03:51 PM EST RP Workstation: [id number]    Medications: I have reviewed the patient's current medications. Scheduled:  aspirin   81 mg Oral Daily   atorvastatin   40 mg Oral Daily   clopidogrel   75 mg Oral Daily   folic acid   1 mg Oral Daily   heparin   5,000 Units Subcutaneous Q8H   hydrocerin   Topical BID   insulin  aspart  0-9 Units Subcutaneous TID WC   multivitamin with minerals  1 tablet Oral Daily   sodium chloride  flush  3 mL Intravenous Q12H   thiamine   100 mg Oral Daily   Or   thiamine   100 mg Intravenous Daily    Assessment/Plan: 63 y.o. male with a history of DM and multiple RLE surgeries leading to weakness in the RLE who presents with  complaints of left sided weakness.  Has had some worsening since admission.  MRI of the brain personally reviewed and reveals an acute right pontine infarct.  CTA of the head and neck reveals personally reviewed and reveals moderate to severe stenosis of the right vertebral artery.  Etiology of infarct likely small vessel disease.  A1c 5.3, LDL 82.  Echocardiogram pending. Repeat head CT on yesterday showed no evidence of hemorrhagic transformation of infarct.  Some improvement in LLE strength noted today.  Lipitor started.   Recommendations: 1. Continue therapy 2. Echocardiogram pending 3. Prophylactic therapy-Dual antiplatelet therapy with ASA 81mg  and Plavix  75mg  for three weeks with change to ASA 81mg  alone as monotherapy after that time.  4. Telemetry monitoring 5. Frequent neuro checks    LOS: 1 day   Sonny Hock, MD Neurology  03/15/2024  9:29 AM

## 2024-03-15 NOTE — Progress Notes (Signed)
 Physical Therapy Treatment Patient Details Name: Terrence Sanchez. MRN: [id number] DOB: Jun 28, 1960 Today's Date: 03/15/2024   History of Present Illness Pt is a 63 y.o. male presenting to ED for evaluation of L-sided facial droop, weakness, loss of sensation and slurred speech. Pt additionally has a full thickness R foot ulcer. CT head neg, MRI showed acute R pontine infarct. PMH significant for DM, multiple failed surgeries for R ankle fx, and R hip ORIF 2023.    PT Comments  Pt showed great effort t/o the PT session but remains very functionally limited due to flaccid L UE and very limited L LE strength/AROM.  He was eager to work hard and do a much A/AA/PROM exercises as possible but is clearly frustrated with his level of acute weakness.  Pt continues to need heavy assist with transitions to/from sitting EOB, but displayed surprisingly good balance once assisted to the seated position.  He did well with R UE reaching outside BOS tasks with ability to weight shift and balance correct relatively well.  Pt will need ongoing PT to address functional limitations, continue with POC.     If plan is discharge home, recommend the following: Two people to help with walking and/or transfers;Two people to help with bathing/dressing/bathroom;Assistance with cooking/housework;Assistance with feeding;Direct supervision/assist for medications management;Assist for transportation;Help with stairs or ramp for entrance   Can travel by private vehicle     No  Equipment Recommendations   (TBD at rehab)    Recommendations for Other Services       Precautions / Restrictions Precautions Precautions: Fall Recall of Precautions/Restrictions: Intact Precaution/Restrictions Comments: LUE hemiparesis Restrictions Weight Bearing Restrictions Per Provider Order: No     Mobility  Bed Mobility Overal bed mobility: Needs Assistance Bed Mobility: Supine to Sit, Sit to Supine     Supine to sit: Mod  assist Sit to supine: Max assist   General bed mobility comments: Pt showed good effort, better able to engage core to assist with transition to sitting, still highly reliant for PT Assist    Transfers                   General transfer comment: deferred standing attempt today with focus on EOB seated weight shifts and balance    Ambulation/Gait                   Stairs             Wheelchair Mobility     Tilt Bed    Modified Rankin (Stroke Patients Only)       Balance Overall balance assessment: Needs assistance   Sitting balance-Leahy Scale: Good Sitting balance - Comments: multiple bouts of 20-30 reaching R UE out to moving targets outside BOS - pt showed good ability to reach far outside BOS in both directions and make appropriate weight shifts and adjustments to insure safety                                    Communication Communication Communication: Impaired Factors Affecting Communication: Reduced clarity of speech (despite facial droop able to communicate effectively)  Cognition Arousal: Alert Behavior During Therapy: [facility] for tasks assessed/performed   PT - Cognitive impairments: No apparent impairments                         Following commands: Intact  Cueing Cueing Techniques: Verbal cues, Gestural cues, Tactile cues, Other (comments)  Exercises General Exercises - Lower Extremity Ankle Circles/Pumps: PROM, 10 reps, Left (little to no AROM) Quad Sets: AROM, 10 reps, Left Gluteal Sets: AROM, 10 reps Long Arc Quad: AAROM, 10 reps, AROM (some AROM knee extension sitting EOB, very little flexion) Heel Slides: AAROM, 10 reps, Left Hip ABduction/ADduction: AAROM, 10 reps, Left Other Exercises Other Exercises: seated reaching - see balance section above    General Comments        Pertinent Vitals/Pain Pain Assessment Pain Assessment: No/denies pain    Home Living                           Prior Function            PT Goals (current goals can now be found in the care plan section) Progress towards PT goals: Progressing toward goals    Frequency    7X/week      PT Plan      Co-evaluation              AM-PAC PT 6 Clicks Mobility   Outcome Measure  Help needed turning from your back to your side while in a flat bed without using bedrails?: A Lot Help needed moving from lying on your back to sitting on the side of a flat bed without using bedrails?: A Lot Help needed moving to and from a bed to a chair (including a wheelchair)?: Total Help needed standing up from a chair using your arms (e.g., wheelchair or bedside chair)?: Total Help needed to walk in hospital room?: Total Help needed climbing 3-5 steps with a railing? : Total 6 Click Score: 8    End of Session   Activity Tolerance: Patient tolerated treatment well Patient left: with bed alarm set;with call bell/phone within reach   PT Visit Diagnosis: Muscle weakness (generalized) (M62.81);Difficulty in walking, not elsewhere classified (R26.2);Hemiplegia and hemiparesis;Other symptoms and signs involving the nervous system (R29.898);Unsteadiness on feet (R26.81) Hemiplegia - Right/Left: Left Hemiplegia - dominant/non-dominant: Non-dominant Hemiplegia - caused by: Cerebral infarction     Time: 8557-8493 PT Time Calculation (min) (ACUTE ONLY): 24 min  Charges:    $Therapeutic Exercise: 8-22 mins $Therapeutic Activity: 8-22 mins PT General Charges $$ ACUTE PT VISIT: 1 Visit                     Carmin JONELLE Deed, DPT 03/15/2024, 4:47 PM

## 2024-03-15 NOTE — Plan of Care (Signed)
°  Problem: Nutrition: Goal: Risk of aspiration will decrease Outcome: Progressing   Problem: Elimination: Goal: Will not experience complications related to bowel motility Outcome: Progressing   Problem: Pain Managment: Goal: General experience of comfort will improve and/or be controlled Outcome: Progressing   Problem: Safety: Goal: Ability to remain free from injury will improve Outcome: Progressing

## 2024-03-15 NOTE — Progress Notes (Signed)
 Speech Language Pathology Treatment: Cognitive-Linguistic  Patient Details Name: Terrence Sanchez. MRN: [id number] DOB: 05-Feb-1961 Today's Date: 03/15/2024 Time: 9079-9059 SLP Time Calculation (min) (ACUTE ONLY): 20 min  Assessment / Plan / Recommendation Clinical Impression  Pt seen for ongoing dysarthria therapy. SLP facilitated session by providing moderate faded to supervision level cues to read list of functional everyday phrases with increased vocal intensity. Slow rate and over-articulation. Pt initially hesitant to use louder voice (I am ashamed of the way I sound. Pt receptive to encouragement provided by this clinical research associate and his NT. Pt then encouraged by how much better his sounded. Use of these strategies were also observed in off-the-cuff comments followed structured practice. Pt committed to practicing his NT offered to practice with him throughout the day.     HPI HPI: Per neurology note, Terrence Sanchez. is an 63 y.o. male with a history of DM and multiple RLE surgeries leading to weakness in the RLE who presents with complaints of left sided weakness.  Patient reports that on 12/10 at 2000 noted left sided facial droop, slurred speech and decreased sensation on the LUE and LLE.  Also developed left sided weakness as well.  With no improvement presented for evaluation.  Reports that since admission has had worsening speech and left upper extremity weakness MRI: Acute right pontine infarct of intermediate size.  2. Multifocal cerebral white matter T2 hyperintensities consistent with chronic  small vessel disease.  3. Generalized cerebral volume loss.      SLP Plan  Continue with current plan of care                     Frequent or constant Supervision/Assistance Dysarthria and anarthria (R47.1)     Continue with current plan of care    Keniya Schlotterbeck B. Rubbie, M.S., CCC-SLP, Tree Surgeon Certified Brain Injury Specialist [facility]  [facility] Rehabilitation Services Office [phone] Ascom [phone] Fax [phone]

## 2024-03-15 NOTE — Progress Notes (Signed)
 Progress Note   Patient: Terrence Sanchez. FMW:[id number] DOB: [birth year] DOA: 03/13/2024     1 DOS: the patient was seen and examined on 03/15/2024   Brief hospital course: HPI:    Terrence Sanchez. is a 63 y.o. year old male without significant known medical hx coming in for evaluation of left sided weakness.      The weakness has been going on for about 2 days. He stated he had no plans of coming to the ED but his son advised him to seek care as he could be having a stroke. Pt denies any acute complaints currently. He reports very concerned about his functional status.      On arrival to the ED patient was noted to be HDS stable. Lab work and imaging obtained. CBC without leukocytosis, CMP with AGMA, moderate hyperglycemia, slight AST/ALT elevation and bilirubin elevation. VBG without any acidosis and lactic acid normal. CTH without any acute findings, but it showed pansinusitis. 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 Given concern for stroke and need for stroke work up, [facility] contacted for admission.  Assessment and Plan:  63 year old male double/PMH of diabetes, HTN, right ankle fracture on chronic boot requiring bilateral crutches for 10 years after he had a right ankle fracture which was fused was admitted from emergency room for acute stroke.   1.  Acute ischemic stroke with left-sided hemiparesis - Patient has been admitted as inpatient for acute ischemic stroke - He is LDL was 82, A1c was 5.3, CTA of the head and neck reveals moderate to severe stenosis on the right vertebral artery. - MRI of the brain showed acute right pontine infarct. - Neurology consult appreciated - Patient to continue PT/OT/ST - Neurologist advised for aspirin , Plavix  for 3 weeks with change to aspirin  81 daily afterward. -Started on statin Lipitor 40 mg at bedtime as his LDL is already around goal -  Continue telemetry - 12/13: Repeat CT scan this morning did not show any hemorrhagic conversion - Echocardiogram done result pending   2.  HTN - Permissive hypertension - Continue to monitor blood pressure   3.  Type 2 diabetes - A1c is normal - Continue sliding scale   4.  Right ankle fusion requiring crutches for the last 10 years - Continue PT/OT - Continue supportive care             Subjective: Still complains of left upper extremity weakness, left lower extremity improved since yesterday  Physical Exam: Vitals:   03/14/24 2354 03/15/24 0338 03/15/24 0440 03/15/24 0720  BP: (!) 140/90 (!) 139/92 100/70 (!) 143/101  Pulse: 65 83 63 71  Resp:    16  Temp: (!) 97.5 F (36.4 C) 97.8 F (36.6 C) (!) 97.5 F (36.4 C) 98.1 F (36.7 C)  TempSrc:   Oral   SpO2: 98% 100% 97% 97%  Weight:   95.2 kg   Height:       Constitutional: Alert, awake, calm, comfortable HEENT: Neck supple Respiratory: Clear to auscultation B/L, no wheezing, no rales.  Cardiovascular: Regular rate and rhythm, no murmurs / rubs / gallops. No extremity edema. 2+ pedal pulses. No carotid bruits.  Abdomen: Soft, no tenderness, Bowel sounds positive.  Musculoskeletal: no clubbing / cyanosis. Good ROM, no contractures. Normal muscle tone.  Skin: no rashes, lesions, ulcers. Neurologic: CN 2-12 grossly intact.  Left upper extremity is flaccid, left lower  extremity he is able to move now Psychiatric: Alert and oriented x 3. Normal mood.    Data Reviewed:  Reviewed echocardiogram pending result  Family Communication: None available  Disposition: Status is: Inpatient Remains inpatient appropriate because: Ongoing recovery from acute stroke requiring rehab placement  Planned Discharge Destination: Rehab ready by tomorrow    Time spent: 35 minutes  Author: Nena Rebel, MD 03/15/2024 11:31 AM  For on call review [email or web address].

## 2024-03-16 LAB — GLUCOSE, CAPILLARY
Glucose-Capillary: 142 mg/dL — ABNORMAL HIGH (ref 70–99)
Glucose-Capillary: 153 mg/dL — ABNORMAL HIGH (ref 70–99)
Glucose-Capillary: 145 mg/dL — ABNORMAL HIGH (ref 70–99)
Glucose-Capillary: 139 mg/dL — ABNORMAL HIGH (ref 70–99)

## 2024-03-16 MED ORDER — LISINOPRIL 5 MG PO TABS
5.0000 mg | ORAL_TABLET | Freq: Every day | ORAL | Status: DC
  Administered 2024-03-16 – 2024-03-17 (×2): 5 mg via ORAL
  Filled 2024-03-16 (×2): qty 1

## 2024-03-16 NOTE — Progress Notes (Signed)
 Progress Note   Patient: Terrence Sanchez. FMW:[id number] DOB: 22-Apr-1960 DOA: 03/13/2024     2 DOS: the patient was seen and examined on 03/16/2024   Brief hospital course: HPI:    Terrence Rivers Hamrick. is a 63 y.o. year old male without significant known medical hx coming in for evaluation of left sided weakness.      The weakness has been going on for about 2 days. He stated he had no plans of coming to the ED but his son advised him to seek care as he could be having a stroke. Pt denies any acute complaints currently. He reports very concerned about his functional status.      On arrival to the ED patient was noted to be HDS stable. Lab work and imaging obtained. CBC without leukocytosis, CMP with AGMA, moderate hyperglycemia, slight AST/ALT elevation and bilirubin elevation. VBG without any acidosis and lactic acid normal. CTH without any acute findings, but it showed pansinusitis. 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 Given concern for stroke and need for stroke work up, [facility] contacted for admission.  Assessment and Plan:  63 year old male double/PMH of diabetes, HTN, right ankle fracture on chronic boot requiring bilateral crutches for 10 years after he had a right ankle fracture which was fused was admitted from emergency room for acute stroke.   1.  Acute ischemic stroke with left-sided hemiparesis - Patient has been admitted as inpatient for acute ischemic stroke - He is LDL was 82, A1c was 5.3, CTA of the head and neck reveals moderate to severe stenosis on the right vertebral artery. - MRI of the brain showed acute right pontine infarct. - Neurology consult appreciated - Patient to continue PT/OT/ST - Neurologist advised for aspirin , Plavix  for 3 weeks with change to aspirin  81 daily afterward. -Lipitor 40 mg at bedtime as his LDL is already around goal - 12/13: Repeat CT  scan this morning did not show any hemorrhagic conversion - Echocardiogram revealed normal ejection fraction at 55 to 60% with negative bubble studies - Waiting to go to acute rehab likely 03/17/2024, TOC working   2.  HTN - He used to take amlodipine  at home but has not been taking since 2023. - He will be started on lisinopril  5 mg daily due to stroke - Continue to monitor blood pressure   3.  Type 2 diabetes - A1c is normal - Continue sliding scale   4.  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 without any acute infection, he is requiring crutches for the last 10 years - Continue PT/OT - Continue supportive care        Subjective: Still has left upper extremity weakness  Physical Exam: Vitals:   03/16/24 0002 03/16/24 0351 03/16/24 0720 03/16/24 1120  BP: (!) 140/79 139/73 (!) 144/83 (!) 143/77  Pulse: 65 (!) 58 65 65  Resp: 18 16 17 20   Temp: 98.7 F (37.1 C) 98.7 F (37.1 C) 98 F (36.7 C) 98.2 F (36.8 C)  TempSrc: Oral Oral Oral Oral  SpO2: 98% 98% 99% 95%  Weight:  95.9 kg    Height:       Constitutional: Alert, awake, calm, comfortable HEENT: Neck supple Respiratory: Clear to auscultation B/L, no wheezing, no rales.  Cardiovascular: Regular rate and rhythm, no murmurs / rubs / gallops. No extremity edema. 2+ pedal pulses. No carotid bruits.  Abdomen: Soft, no  tenderness, Bowel sounds positive.  Musculoskeletal: no clubbing / cyanosis. Good ROM, no contractures. Normal muscle tone.  Skin: no rashes, lesions, ulcers. Neurologic: CN 2-12 grossly intact.  Left upper extremity is flaccid, left lower extremity able to move Psychiatric: Alert and oriented x 3. Normal mood.    Data Reviewed:  Reviewed  Family Communication: None  Disposition: Status is: Inpatient Remains inpatient appropriate because: Ongoing recovery from acute stroke requiring rehab placement  Planned Discharge Destination: Rehab    Time spent: 35 minutes  Author: Nena Rebel, MD 03/16/2024 12:27 PM  For on call review [email or web address].

## 2024-03-16 NOTE — TOC Progression Note (Signed)
 Transition of Care (TOC) - Progression Note    Patient Details  Name: Terrence Sanchez. MRN: [id number] Date of Birth: 1960/06/05  Transition of Care [facility]) CM/SW Contact  Dalia GORMAN Fuse, RN Phone Number: 03/16/2024, 1:01 PM  Clinical Narrative:    TOC spoke with the patient and family in his room. He is in agreement with going to [facility] for STR when 3 night inpt criteria is met. TOC will continue to follow.   Expected Discharge Plan: Skilled Nursing Facility                 Expected Discharge Plan and Services       Living arrangements for the past 2 months: Single Family Home                                       Social Drivers of Health (SDOH) Interventions SDOH Screenings   Food Insecurity: No Food Insecurity (03/13/2024)  Housing: Low Risk (03/13/2024)  Transportation Needs: No Transportation Needs (03/13/2024)  Utilities: Not At Risk (03/13/2024)  Social Connections: Socially Isolated (03/13/2024)  Tobacco Use: Low Risk (03/13/2024)    Readmission Risk Interventions     No data to display

## 2024-03-16 NOTE — Plan of Care (Signed)
°  Problem: Education: Goal: Knowledge of disease or condition will improve Outcome: Progressing Goal: Knowledge of secondary prevention will improve (MUST DOCUMENT ALL) Outcome: Progressing Goal: Knowledge of patient specific risk factors will improve (DELETE if not current risk factor) Outcome: Progressing   Problem: Ischemic Stroke/TIA Tissue Perfusion: Goal: Complications of ischemic stroke/TIA will be minimized Outcome: Progressing   Problem: Health Behavior/Discharge Planning: Goal: Ability to manage health-related needs will improve Outcome: Progressing Goal: Goals will be collaboratively established with patient/family Outcome: Progressing   Problem: Self-Care: Goal: Ability to participate in self-care as condition permits will improve Outcome: Progressing Goal: Verbalization of feelings and concerns over difficulty with self-care will improve Outcome: Progressing Goal: Ability to communicate needs accurately will improve Outcome: Progressing   Problem: Nutrition: Goal: Risk of aspiration will decrease Outcome: Progressing Goal: Dietary intake will improve Outcome: Progressing   Problem: Education: Goal: Knowledge of General Education information will improve Description: Including pain rating scale, medication(s)/side effects and non-pharmacologic comfort measures Outcome: Progressing   Problem: Health Behavior/Discharge Planning: Goal: Ability to manage health-related needs will improve Outcome: Progressing   Problem: Clinical Measurements: Goal: Ability to maintain clinical measurements within normal limits will improve Outcome: Progressing Goal: Will remain free from infection Outcome: Progressing Goal: Diagnostic test results will improve Outcome: Progressing Goal: Respiratory complications will improve Outcome: Progressing Goal: Cardiovascular complication will be avoided Outcome: Progressing   Problem: Activity: Goal: Risk for activity intolerance  will decrease Outcome: Progressing   Problem: Nutrition: Goal: Adequate nutrition will be maintained Outcome: Progressing   Problem: Coping: Goal: Level of anxiety will decrease Outcome: Progressing   Problem: Pain Managment: Goal: General experience of comfort will improve and/or be controlled Outcome: Progressing   Problem: Safety: Goal: Ability to remain free from injury will improve Outcome: Progressing   Problem: Skin Integrity: Goal: Risk for impaired skin integrity will decrease Outcome: Progressing   Problem: Education: Goal: Ability to describe self-care measures that may prevent or decrease complications (Diabetes Survival Skills Education) will improve Outcome: Progressing Goal: Individualized Educational Video(s) Outcome: Progressing

## 2024-03-16 NOTE — Progress Notes (Signed)
 Speech Language Pathology Treatment: Cognitive-Linguistic (motor speech)  Patient Details Name: Terrence Sanchez. MRN: [id number] DOB: October 03, 1960 Today's Date: 03/16/2024 Time: 8599-8574 SLP Time Calculation (min) (ACUTE ONLY): 25 min  Assessment / Plan / Recommendation Clinical Impression  Pt seen for follow up motor speech intervention, pt eager for assessment. Pt with noted internal distractions (frustration with current limitations) and intermittently emotional during session. Gentle redirection and affirmation provided. Min-mod dysarthria continues, though pt with emerging independence with application of compensatory strategies, including increasing volume and slowing rate of speech. Strategy application noted with structured targets and in simple conversation. Intelligibility maintained at greater than 85%. Education shared regarding plan for follow up therapy at next venue of care, pt reported understanding. SLP will monitor for additional needs during acute stay.    HPI HPI: Per neurology note, Terrence Sanchez. is an 63 y.o. male with a history of DM and multiple RLE surgeries leading to weakness in the RLE who presents with complaints of left sided weakness.  Patient reports that on 12/10 at 2000 noted left sided facial droop, slurred speech and decreased sensation on the LUE and LLE.  Also developed left sided weakness as well.  With no improvement presented for evaluation.  Reports that since admission has had worsening speech and left upper extremity weakness MRI: Acute right pontine infarct of intermediate size.  2. Multifocal cerebral white matter T2 hyperintensities consistent with chronic  small vessel disease.  3. Generalized cerebral volume loss.      SLP Plan  Continue with current plan of care         Recommendations                        Frequent or constant Supervision/Assistance Dysarthria and anarthria (R47.1)     Continue with current plan of  care    [name], MS, CCC-SLP Speech Language Pathologist Rehab Services; [facility] [phone] (ascom)   [name]  03/16/2024, 3:18 PM

## 2024-03-16 NOTE — Progress Notes (Signed)
 Occupational Therapy Treatment Patient Details Name: Terrence Sanchez. MRN: [id number] DOB: 1960/09/05 Today's Date: 03/16/2024   History of present illness Pt is a 63 y.o. male presenting to ED for evaluation of L-sided facial droop, weakness, loss of sensation and slurred speech. Pt additionally has a full thickness R foot ulcer. CT head neg, MRI showed acute R pontine infarct. PMH significant for DM, multiple failed surgeries for R ankle fx, and R hip ORIF 2023.   OT comments  Pt is supine in bed on arrival. Easily arousable and agreeable to OT/PT co-tx session. He denies pain. Pt performed bed mobility with Mod A x2, cues for sequencing/utilizing RLE to assist LLE movement. Good seated balance at EOB when reaching out of BOS to assist with LB dressing of R boot, overall requires Max A for LB dressing and UB dressing to manage changing out gowns. Re-edu on importance of LUE and LLE A/AAROM/PROM HEP to promote improved tone, ROM and awareness to LUE/LLE. Pt required +2 Max A for 5 STS bouts during session and lateral scoots to [facility] with cues for midline d/t L lateral lean with poor awareness of center of gravity both sitting and standing. Attempted different blocking techniques for RLE d/t noted inversion/IR during standing trials, however pt with increased pain from previous ankle sx when trying to maintain neutral position causing high fall risk while standing. Continues with only trace activation to L tricep and with shoulder elevation on LUE, otherwise flaccid.  Pt returned to bed with all needs in place and will cont to require skilled acute OT services to maximize his safety and IND to return to PLOF.       If plan is discharge home, recommend the following:  Two people to help with walking and/or transfers;Two people to help with bathing/dressing/bathroom   Equipment Recommendations  None recommended by OT (defer to next venue)    Recommendations for Other Services      Precautions  / Restrictions Precautions Precautions: Fall Recall of Precautions/Restrictions: Intact Restrictions Weight Bearing Restrictions Per Provider Order: No Other Position/Activity Restrictions: chronic R foot pain 9/10 with WB; uses an old boot to WB       Mobility Bed Mobility Overal bed mobility: Needs Assistance Bed Mobility: Supine to Sit, Sit to Supine     Supine to sit: Mod assist, +2 for physical assistance Sit to supine: Mod assist, +2 for physical assistance   General bed mobility comments: assist for trunkal elevation and return to supine as well as cues and assist to hook LLE with RLE to manage it on/off bed; increased assist for all scooting tasks    Transfers Overall transfer level: Needs assistance Equipment used: 2 person hand held assist Transfers: Sit to/from Stand Sit to Stand: Max assist, +2 physical assistance           General transfer comment: required +2 Max A for 5 STS bouts during session and lateral scoots to [facility] with cues for midline d/t L lateral lean with poor awareness; attempted different blocking technique for RLE d/t noted inversion/IR during standing trials, however pt with increased pain from previous ankle sx when trying to maintain neutral position     Balance Overall balance assessment: Needs assistance Sitting-balance support: No upper extremity supported, Feet supported Sitting balance-Leahy Scale: Good     Standing balance support: During functional activity, Bilateral upper extremity supported Standing balance-Leahy Scale: Poor Standing balance comment: +2 heavy external support to maintain standing balance for only brief periods with  increased L lateral lean noted in standing and unable to correct withotu assist                           ADL either performed or assessed with clinical judgement   ADL Overall ADL's : Needs assistance/impaired                 Upper Body Dressing : Maximal assistance;Cueing for  sequencing;Cueing for compensatory techniques Upper Body Dressing Details (indicate cue type and reason): to change out gown at bed level d/t being soiled Lower Body Dressing: Sitting/lateral leans;Maximal assistance Lower Body Dressing Details (indicate cue type and reason): to donn old R boot, however pt is able to reach down to bil feet and maintain balance             Functional mobility during ADLs: +2 for physical assistance;+2 for safety/equipment;Maximal assistance General ADL Comments: required +2 Max A for STS bouts during session and lateral scoots to [facility] with cues for midline d/t L lateral lean with poor awareness    Extremity/Trunk Assessment Upper Extremity Assessment LUE Deficits / Details: pt reports regression of LUE functionality since he first camein, continues to have trace activation in scapular elevation and tricep, flaccid otherwise. no active grip or tone in bicep. LUE Sensation: WNL LUE Coordination: decreased fine motor;decreased gross motor   Lower Extremity Assessment Lower Extremity Assessment: Defer to PT evaluation        Vision       Perception     Praxis     Communication Communication Communication: Impaired Factors Affecting Communication: Reduced clarity of speech   Cognition Arousal: Alert Behavior During Therapy: WFL for tasks assessed/performed               OT - Cognition Comments: very sad about his overall situation                 Following commands: Intact        Cueing   Cueing Techniques: Verbal cues, Tactile cues  Exercises Other Exercises Other Exercises: Edu on continued AAROM/PROM to LUE and LLE as well as weight bearing throughing LUE to promote improve tone and awareness.    Shoulder Instructions       General Comments remains emotional regarding lack of LUE functional use and overall functional status    Pertinent Vitals/ Pain       Pain Assessment Pain Assessment: 0-10 Pain Score: 9  Pain  Location: R foot with WB Pain Descriptors / Indicators: Discomfort, Grimacing, Moaning Pain Intervention(s): Limited activity within patient's tolerance, Monitored during session, Repositioned  Home Living                                          Prior Functioning/Environment              Frequency  Min 3X/week        Progress Toward Goals  OT Goals(current goals can now be found in the care plan section)  Progress towards OT goals: Progressing toward goals  Acute Rehab OT Goals OT Goal Formulation: With patient Time For Goal Achievement: 03/28/24 Potential to Achieve Goals: Good  Plan      Co-evaluation    PT/OT/SLP Co-Evaluation/Treatment: Yes Reason for Co-Treatment: Complexity of the patient's impairments (multi-system involvement);To address functional/ADL transfers PT goals addressed during session: Mobility/safety  with mobility OT goals addressed during session: ADL's and self-care      AM-PAC OT 6 Clicks Daily Activity     Outcome Measure   Help from another person eating meals?: A Little Help from another person taking care of personal grooming?: A Lot Help from another person toileting, which includes using toliet, bedpan, or urinal?: A Lot Help from another person bathing (including washing, rinsing, drying)?: A Lot Help from another person to put on and taking off regular upper body clothing?: A Lot Help from another person to put on and taking off regular lower body clothing?: A Lot 6 Click Score: 13    End of Session Equipment Utilized During Treatment: Gait belt  OT Visit Diagnosis: Hemiplegia and hemiparesis;Other abnormalities of gait and mobility (R26.89);Unsteadiness on feet (R26.81);Other symptoms and signs involving the nervous system (R29.898) Hemiplegia - Right/Left: Left Hemiplegia - dominant/non-dominant: Non-Dominant Hemiplegia - caused by: Cerebral infarction   Activity Tolerance Patient tolerated treatment  well;No increased pain   Patient Left in bed;with call bell/phone within reach;with nursing/sitter in room;with bed alarm set   Nurse Communication Mobility status        Time: 1325-1402 OT Time Calculation (min): 37 min  Charges: OT General Charges $OT Visit: 1 Visit OT Treatments $Neuromuscular Re-education: 8-22 mins  Ezma Rehm Chrismon, OTR/L  03/16/2024, 4:26 PM   Daris Harkins E Chrismon 03/16/2024, 4:23 PM

## 2024-03-16 NOTE — Plan of Care (Signed)
°  Problem: Clinical Measurements: Goal: Ability to maintain clinical measurements within normal limits will improve Outcome: Progressing Goal: Will remain free from infection Outcome: Progressing Goal: Diagnostic test results will improve Outcome: Progressing   Problem: Coping: Goal: Will verbalize positive feelings about self Outcome: Progressing

## 2024-03-16 NOTE — Plan of Care (Signed)
 Neurology plan of care  Please see neurology progress note from yesterday for full findings and recommendations to date. This is a 63 y.o. male with a history of DM and multiple RLE surgeries leading to weakness in the RLE who presents with complaints of left sided weakness.  Has had some worsening since admission.  MRI of the brain personally reviewed and reveals an acute right pontine infarct.  CTA of the head and neck reveals personally reviewed and reveals moderate to severe stenosis of the right vertebral artery.  Etiology of infarct likely small vessel disease.  A1c 5.3, LDL 82.    TTE results from yesterday:  1. Left ventricular ejection fraction, by estimation, is 55 to 60% . The left ventricle has normal function. The left ventricle has no regional wall motion abnormalities. Left ventricular diastolic parameters are consistent with Grade I diastolic dysfunction ( impaired relaxation) . 2. Right ventricular systolic function is normal. The right ventricular size is normal. 3. The mitral valve is normal in structure. Trivial mitral valve regurgitation. No evidence of mitral stenosis. 4. The aortic valve is normal in structure. Aortic valve regurgitation is not visualized. No aortic stenosis is present. 5. The inferior vena cava is normal in size with greater than 50% respiratory variability, suggesting right atrial pressure of 3 mmHg. 6. Agitated saline contrast bubble study was negative, with no evidence of any interatrial shunt.  Final recommendations: 1. Continue therapy 2. Atorvastatin  40mg  daily 3. Prophylactic therapy-Dual antiplatelet therapy with ASA 81mg  and Plavix  75mg  for three weeks with change to ASA 81mg  alone as monotherapy after that time.  4. Telemetry monitoring 5. Frequent neuro checks   I will arrange outpatient neurology f/u. Neurology will be available prn for questions going forward.  Elida Ross, MD [facility] [phone]  If 7pm- 7am, please page  neurology on call as listed in [facility].

## 2024-03-16 NOTE — Progress Notes (Addendum)
 Physical Therapy Treatment Patient Details Name: Terrence Sanchez. MRN: [id number] DOB: [birth year] Today's Date: 03/16/2024   History of Present Illness Pt is a 63 y.o. male presenting to ED for evaluation of L-sided facial droop, weakness, loss of sensation and slurred speech. Pt additionally has a full thickness R foot ulcer. CT head neg, MRI showed acute R pontine infarct. PMH significant for DM, multiple failed surgeries for R ankle fx, and R hip ORIF 2023.    PT Comments  PT/OT cotx on this date. Pt received in semi-supine and agreeable to therapy. Pt continues to demonstrate LUE > LLE deficits and has been practicing AAROM of LUE while in bed. Today he was able to perform STS x 5 with +2 assist, LUE cradle, LLE knee block, and RLE block at foot to reduce inversion and IR upon stand. Pt perceived midline awareness is impaired on assessment and he demonstrates L lateral lean that requires MAX A to correct in standing. Pt edu on importance of LUE WB to improve neuro-muscular connection--mild trace activation in triceps. Pt performed R lateral scoots to [facility] with +2 max and edu for head-hips relationship. Pt returned to semi-supine with all needs met. He will continue to benefit from skilled PT services to address his remaining deficits.    If plan is discharge home, recommend the following: Two people to help with walking and/or transfers;Two people to help with bathing/dressing/bathroom;Assistance with cooking/housework;Assistance with feeding;Direct supervision/assist for medications management;Assist for transportation;Help with stairs or ramp for entrance   Can travel by private vehicle     No  Equipment Recommendations  Other (comment) (TBD at next venue)    Recommendations for Other Services       Precautions / Restrictions Precautions Precautions: Fall Recall of Precautions/Restrictions: Intact Restrictions Weight Bearing Restrictions Per Provider Order: No Other  Position/Activity Restrictions: chronic R foot pain 9/10 with WB; uses an old boot to WB     Mobility  Bed Mobility Overal bed mobility: Needs Assistance Bed Mobility: Supine to Sit, Sit to Supine     Supine to sit: Mod assist Sit to supine: Mod assist   General bed mobility comments: used foot hook to guide LLE on/off bed    Transfers Overall transfer level: Needs assistance Equipment used: 2 person hand held assist Transfers: Sit to/from Stand Sit to Stand: Max assist, +2 physical assistance           General transfer comment: utilized LUE cradle, LLE knee block and RLE foot block to reduce inversion and IR with stand; MAX +2 for safety and to reduce L lateral lean    Ambulation/Gait               General Gait Details: NT - unsafe   Stairs             Wheelchair Mobility     Tilt Bed    Modified Rankin (Stroke Patients Only)       Balance Overall balance assessment: Needs assistance Sitting-balance support: No upper extremity supported, Feet supported Sitting balance-Leahy Scale: Good     Standing balance support: During functional activity Standing balance-Leahy Scale: Poor Standing balance comment: heavy reliance on external support with standing; L lateral lean with MAX to bring back to neutral                            Communication Communication Communication: Impaired Factors Affecting Communication: Reduced clarity of speech  Cognition Arousal:  Alert Behavior During Therapy: WFL for tasks assessed/performed   PT - Cognitive impairments: No apparent impairments                         Following commands: Intact      Cueing Cueing Techniques: Verbal cues, Tactile cues  Exercises Other Exercises Other Exercises: edu on AAROM movements of LUE and LLE Other Exercises: edu on WB through LUE    General Comments        Pertinent Vitals/Pain Pain Assessment Pain Assessment: 0-10 Pain Score: 9  Pain  Location: R foot with WB Pain Descriptors / Indicators: Discomfort, Grimacing, Moaning Pain Intervention(s): Monitored during session, Limited activity within patient's tolerance    Home Living                          Prior Function            PT Goals (current goals can now be found in the care plan section) Acute Rehab PT Goals Patient Stated Goal: to get better PT Goal Formulation: With patient Time For Goal Achievement: 03/27/24 Potential to Achieve Goals: Fair Progress towards PT goals: Progressing toward goals    Frequency    Min 3X/week      PT Plan      Co-evaluation PT/OT/SLP Co-Evaluation/Treatment: Yes Reason for Co-Treatment: Complexity of the patient's impairments (multi-system involvement);To address functional/ADL transfers PT goals addressed during session: Mobility/safety with mobility OT goals addressed during session: ADL's and self-care      AM-PAC PT 6 Clicks Mobility   Outcome Measure  Help needed turning from your back to your side while in a flat bed without using bedrails?: A Lot Help needed moving from lying on your back to sitting on the side of a flat bed without using bedrails?: A Lot Help needed moving to and from a bed to a chair (including a wheelchair)?: Total Help needed standing up from a chair using your arms (e.g., wheelchair or bedside chair)?: Total Help needed to walk in hospital room?: Total Help needed climbing 3-5 steps with a railing? : Total 6 Click Score: 8    End of Session Equipment Utilized During Treatment: Gait belt Activity Tolerance: Patient tolerated treatment well Patient left: in bed;with call bell/phone within reach;with bed alarm set   PT Visit Diagnosis: Muscle weakness (generalized) (M62.81);Difficulty in walking, not elsewhere classified (R26.2);Hemiplegia and hemiparesis;Other symptoms and signs involving the nervous system (R29.898);Unsteadiness on feet (R26.81) Hemiplegia - Right/Left:  Left Hemiplegia - dominant/non-dominant: Non-dominant Hemiplegia - caused by: Cerebral infarction     Time: 1325-1402 PT Time Calculation (min) (ACUTE ONLY): 37 min  Charges:                            Allena Bulls, SPT   Maryanne Finder, PT, DPT Physical Therapist - [facility]  [facility] 03/16/2024, 2:44 PM

## 2024-03-17 ENCOUNTER — Encounter: Payer: Self-pay | Admitting: Internal Medicine

## 2024-03-17 ENCOUNTER — Other Ambulatory Visit: Payer: Self-pay

## 2024-03-17 DIAGNOSIS — I10 Essential (primary) hypertension: Secondary | ICD-10-CM | POA: Insufficient documentation

## 2024-03-17 DIAGNOSIS — R29898 Other symptoms and signs involving the musculoskeletal system: Secondary | ICD-10-CM | POA: Insufficient documentation

## 2024-03-17 DIAGNOSIS — G4700 Insomnia, unspecified: Secondary | ICD-10-CM | POA: Insufficient documentation

## 2024-03-17 LAB — GLUCOSE, CAPILLARY
Glucose-Capillary: 130 mg/dL — ABNORMAL HIGH (ref 70–99)
Glucose-Capillary: 153 mg/dL — ABNORMAL HIGH (ref 70–99)

## 2024-03-17 MED ORDER — ONDANSETRON HCL 4 MG PO TABS
4.0000 mg | ORAL_TABLET | Freq: Four times a day (QID) | ORAL | 0 refills | Status: AC | PRN
  Filled 2024-03-17: qty 20, 5d supply, fill #0

## 2024-03-17 MED ORDER — ATORVASTATIN CALCIUM 40 MG PO TABS
40.0000 mg | ORAL_TABLET | Freq: Every day | ORAL | 2 refills | Status: AC
  Filled 2024-03-17: qty 30, 30d supply, fill #0

## 2024-03-17 MED ORDER — CLOPIDOGREL BISULFATE 75 MG PO TABS
75.0000 mg | ORAL_TABLET | Freq: Every day | ORAL | 0 refills | Status: AC
  Filled 2024-03-17: qty 17, 17d supply, fill #0

## 2024-03-17 MED ORDER — VITAMIN B-1 100 MG PO TABS
100.0000 mg | ORAL_TABLET | Freq: Every day | ORAL | 0 refills | Status: AC
  Filled 2024-03-17: qty 90, 90d supply, fill #0

## 2024-03-17 MED ORDER — ASPIRIN 81 MG PO CHEW
81.0000 mg | CHEWABLE_TABLET | Freq: Every day | ORAL | 2 refills | Status: AC
  Filled 2024-03-17: qty 30, 30d supply, fill #0

## 2024-03-17 MED ORDER — LISINOPRIL 5 MG PO TABS
5.0000 mg | ORAL_TABLET | Freq: Every day | ORAL | 1 refills | Status: AC
  Filled 2024-03-17: qty 30, 30d supply, fill #0

## 2024-03-17 MED ORDER — FOLIC ACID 1 MG PO TABS
1.0000 mg | ORAL_TABLET | Freq: Every day | ORAL | 0 refills | Status: AC
  Filled 2024-03-17: qty 90, 90d supply, fill #0

## 2024-03-17 MED ORDER — HYDROCERIN EX CREA
1.0000 | TOPICAL_CREAM | Freq: Two times a day (BID) | CUTANEOUS | 0 refills | Status: AC
  Filled 2024-03-17: qty 240, 30d supply, fill #0

## 2024-03-17 MED ORDER — HYDRALAZINE HCL 20 MG/ML IJ SOLN: 5.0000 mg | Freq: Four times a day (QID) | INTRAMUSCULAR | Status: DC | PRN

## 2024-03-17 MED ORDER — TRAZODONE HCL 50 MG PO TABS: 25.0000 mg | ORAL_TABLET | Freq: Every evening | ORAL | 0 refills | Status: AC | PRN

## 2024-03-17 NOTE — Assessment & Plan Note (Signed)
 Insulin  SSI 3 times daily with meals

## 2024-03-17 NOTE — Assessment & Plan Note (Signed)
 Trazodone  25 mg nightly for sleep

## 2024-03-17 NOTE — Assessment & Plan Note (Addendum)
 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

## 2024-03-17 NOTE — Progress Notes (Addendum)
 Occupational Therapy Treatment Patient Details Name: Terrence Sanchez. MRN: [id number] DOB: June 12, 1960 Today's Date: 03/17/2024   History of present illness Pt is a 63 y.o. male presenting to ED for evaluation of L-sided facial droop, weakness, loss of sensation and slurred speech. Pt additionally has a full thickness R foot ulcer. CT head neg, MRI showed acute R pontine infarct. PMH significant for DM, multiple failed surgeries for R ankle fx, and R hip ORIF 2023.   OT comments  Pt is supine in bed on arrival. Pleasant and agreeable to OT session. He denies pain. Pt performed bed mobility with Mod A x2 for trunkal assist and BLE management. Noted with improved balance and less L lateral lean once seated EOB this date. Performed dynamic seated balance activities at EOB including lateral and forward/backward weight shifts, LUE weight bearing exercises and PROM/AAROM/AROM to LUE and LLE exercises. LUE deficits > LLE deficits. Pt less emotional today regarding functional deficits, remains motivated and reports performing LUE PROM exercises. Reached out to MD regarding LUE sling to prevent subluxation and MD in agreement. OT assists with proper positioning and edu on protection of LUE during all ROM and functional movements. Pt returned to bed with all needs in place and will cont to require skilled acute OT services to maximize his safety and IND to return to PLOF.       If plan is discharge home, recommend the following:  Two people to help with walking and/or transfers;Two people to help with bathing/dressing/bathroom   Equipment Recommendations  None recommended by OT (defer)    Recommendations for Other Services      Precautions / Restrictions Precautions Precautions: Fall Recall of Precautions/Restrictions: Intact Restrictions Weight Bearing Restrictions Per Provider Order: No Other Position/Activity Restrictions: chronic R foot pain 9/10 with WB; uses an old boot to WB        Mobility Bed Mobility Overal bed mobility: Needs Assistance Bed Mobility: Supine to Sit, Sit to Supine, Rolling Rolling: Max assist, Mod assist   Supine to sit: Mod assist, +2 for physical assistance Sit to supine: Mod assist, +2 for physical assistance   General bed mobility comments: rolling for repositioning of chux pads, assist for BLE management and trunkal elevation with cues for hand placement and bedrail use    Transfers                   General transfer comment: deferred this date, session focused on seated balance, weight shifts and neuro-redu     Balance Overall balance assessment: Needs assistance Sitting-balance support: No upper extremity supported, Feet supported Sitting balance-Leahy Scale: Good                                     ADL either performed or assessed with clinical judgement   ADL Overall ADL's : Needs assistance/impaired                                       General ADL Comments: session focused on neuro-re-edu and balance/core strengthening    Extremity/Trunk Assessment Upper Extremity Assessment Upper Extremity Assessment: Right hand dominant;LUE deficits/detail LUE Deficits / Details: pt reports regression of LUE functionality since he first camein, continues to have trace activation in scapular elevation and tricep, flaccid otherwise. no active grip or tone in bicep. LUE Sensation:  WNL LUE Coordination: decreased fine motor;decreased gross motor   Lower Extremity Assessment Lower Extremity Assessment: Defer to PT evaluation   Cervical / Trunk Assessment Cervical / Trunk Assessment: Normal    Vision       Perception     Praxis     Communication Communication Communication: Impaired Factors Affecting Communication: Reduced clarity of speech   Cognition Arousal: Alert Behavior During Therapy: WFL for tasks assessed/performed               OT - Cognition Comments: very sad about  his overall situation                 Following commands: Intact        Cueing   Cueing Techniques: Verbal cues, Tactile cues  Exercises Other Exercises Other Exercises: Edu on continued AAROM/PROM to LUE and LLE as well as weight bearing throughing LUE to promote improve tone and awareness, proprioception. Other Exercises: Session focused on dynamic seated balance tasks and weight shifting forwrd/backward and laterally to improve core strength and functional balance.    Shoulder Instructions       General Comments  (Right foot Aircast boot aquired to provide better support at foot and ankle after verbal discussion with Dr. Sherre.)    Pertinent Vitals/ Pain       Pain Assessment Pain Assessment: No/denies pain Pain Intervention(s): Monitored during session, Repositioned  Home Living                                          Prior Functioning/Environment              Frequency  Min 3X/week        Progress Toward Goals  OT Goals(current goals can now be found in the care plan section)  Progress towards OT goals: Progressing toward goals  Acute Rehab OT Goals OT Goal Formulation: With patient Time For Goal Achievement: 03/28/24 Potential to Achieve Goals: Good  Plan      Co-evaluation    PT/OT/SLP Co-Evaluation/Treatment: Yes Reason for Co-Treatment: Complexity of the patient's impairments (multi-system involvement);To address functional/ADL transfers PT goals addressed during session: Mobility/safety with mobility OT goals addressed during session: ADL's and self-care      AM-PAC OT 6 Clicks Daily Activity     Outcome Measure   Help from another person eating meals?: A Little Help from another person taking care of personal grooming?: A Lot Help from another person toileting, which includes using toliet, bedpan, or urinal?: A Lot Help from another person bathing (including washing, rinsing, drying)?: A Lot Help from another  person to put on and taking off regular upper body clothing?: A Lot Help from another person to put on and taking off regular lower body clothing?: A Lot 6 Click Score: 13    End of Session    OT Visit Diagnosis: Hemiplegia and hemiparesis;Other abnormalities of gait and mobility (R26.89);Unsteadiness on feet (R26.81);Other symptoms and signs involving the nervous system (R29.898) Hemiplegia - Right/Left: Left Hemiplegia - dominant/non-dominant: Non-Dominant Hemiplegia - caused by: Cerebral infarction   Activity Tolerance Patient tolerated treatment well;No increased pain   Patient Left in bed;with call bell/phone within reach;with nursing/sitter in room;with bed alarm set   Nurse Communication Mobility status        Time: 9070-8994 OT Time Calculation (min): 36 min  Charges: OT General Charges $OT Visit: 1 Visit  OT Treatments $Neuromuscular Re-education: 8-22 mins  Doreatha Offer Chrismon, OTR/L  03/17/2024, 2:00 PM   Kirsten Mckone E Chrismon 03/17/2024, 1:55 PM

## 2024-03-17 NOTE — Discharge Summary (Signed)
 Physician Discharge Summary   Patient: Terrence Sanchez. MRN: [id number] DOB: May 17, 1960  Admit date:     03/13/2024  Discharge date: 03/17/2024  Discharge Physician: Dr. Sherre   PCP: Pcp, No   Recommendations at discharge:   Take aspirin  81 mg daily + plavix  75 mg daily for 17 days. Finish the plavix  as ordered. After you complete all the plavix  prescribed, continue aspirin  81 mg daily. Both Rx have been prescribed. Take high blood pressure medications, lisinopril  5 my daily by mouth, Rx have been prescribed. Follow-up with neurology. Take cholesterol medication, atorvastatin  40 mg nightly by mouth,  as it is also for secondary stroke prevention. Rx have been prescribed. Follow-up with outpatient PCP within 4 weeks of discharge. Work hard with physical/occupational therapy at the SNF.  Discharge Diagnoses: Principal Problem:   Cerebral infarction [name] Active Problems:   Transaminitis   Hyperglycemia   Essential hypertension   Lower extremity weakness   Insomnia  Resolved Problems:   * No resolved hospital problems. *  Hospital Course: Terrence Deitrich Steve. is a 63 y.o. year old male without significant known medical hx coming in for evaluation of left sided weakness.      The weakness has been going on for about 2 days. He stated he had no plans of coming to the ED but his son advised him to seek care as he could be having a stroke. Pt denies any acute complaints currently. He reports very concerned about his functional status.    On arrival to the ED patient was noted to be HDS stable. Lab work and imaging obtained. CBC without leukocytosis, CMP with AGMA, moderate hyperglycemia, slight AST/ALT elevation and bilirubin elevation. VBG without any acidosis and lactic acid normal. CTH without any acute findings, but it showed pansinusitis. 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  Given concern for stroke and need for stroke work up, [facility] contacted for admission.  12/12: patient presented to the ED for chief concerns of left sided weakness.  12/12: patient admitted to the hospital with concerns of stroke/tia. MRI brain wo cm: acute right pontine infarct of intermediate size. Multifocal cerebral white matter hyperintensities consistent with chronic small vessel disease. Generalized cerebral volume loss.  12/13: neurology consulted and high intensity statin initiated. PT/OT evaluated patient. Echo on 03/14/24: LV EF estimated at 55-60%.  12/15: plan of care by Neurology: Continue asa 81/plavix  75 mg daily to complete 21 day course, then change to aspirin  81 mg as monotherapy; atorvasatin 40 mg daily, and Dr. Zollie states she will arrange outpatient neurology follow-up.   12/16: I assumed care of the patient. Bed available at [facility] and patient/family accepted the bed. Pt reports his family has been updated. At bedside, he denies chest pain, shortness of breath, new pain, abdominal pain, dysuria. He reports chronic right shoulder pain for more > 30 years that is unchanged.   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 and patient was instructed, not to drive, operate heavy machinery, perform activities at heights, swimming or participation in water activities or provide baby-sitting services while on Pain, Sleep and Anxiety Medications; until their outpatient Physician has advised to do so again. Also recommended to not to take more than prescribed Pain, Sleep and Anxiety Medications.  Consultants: Neurology, PT, OT, TOC Procedures performed: none  Disposition: Skilled nursing facility Diet recommendation:  Cardiac and Carb modified diet DISCHARGE MEDICATION: Allergies as of 03/17/2024   No Known Allergies      Medication List     STOP taking these medications    amLODipine  5 MG tablet Commonly known as: NORVASC    enoxaparin  40 MG/0.4ML injection Commonly known as: LOVENOX    methocarbamol  500 MG tablet Commonly known as: ROBAXIN    oxyCODONE  5 MG immediate release tablet Commonly known as: Oxy IR/ROXICODONE        TAKE these medications    acetaminophen  325 MG tablet Commonly known as: TYLENOL  Take 650 mg by mouth every 4 (four) hours as needed for moderate pain (pain score 4-6) or mild pain (pain score 1-3).   aspirin  81 MG chewable tablet Chew 1 tablet (81 mg total) by mouth daily. Start taking on: March 18, 2024   atorvastatin  40 MG tablet Commonly known as: LIPITOR Take 1 tablet (40 mg total) by mouth daily. Start taking on: March 18, 2024   bisacodyl  10 MG suppository Commonly known as: DULCOLAX Place 1 suppository (10 mg total) rectally daily as needed for moderate constipation.   clopidogrel  75 MG tablet Commonly known as: PLAVIX  Take 1 tablet (75 mg total) by mouth daily for 17 days. Start taking on: March 18, 2024   docusate sodium  100 MG capsule Commonly known as: COLACE Take 1 capsule (100 mg total) by mouth 2 (two) times daily.   folic acid  1 MG tablet Commonly known as: FOLVITE  Take 1 tablet (1 mg total) by mouth daily. Start taking on:  March 18, 2024   hydrocerin Crea Apply 1 Application topically 2 (two) times daily.   lisinopril  5 MG tablet Commonly known as: ZESTRIL  Take 1 tablet (5 mg total) by mouth daily. Start taking on: March 18, 2024   Mega Multiple/Chelated Mineral Tabs Take 1 tablet by mouth daily.   multivitamin with minerals Tabs tablet Take 1 tablet by mouth daily.   ondansetron  4 MG tablet Commonly known as: ZOFRAN  Take 1 tablet (4 mg total) by mouth every 6 (six) hours as needed for nausea.   thiamine  100 MG tablet Commonly known as: Vitamin B-1 Take 1 tablet (100 mg total) by mouth daily. Start taking on: March 18, 2024   traZODone  50 MG tablet Commonly known as: DESYREL  Take 0.5 tablets (25 mg total) by mouth at bedtime as needed for sleep.       Discharge Exam: Filed Weights   03/15/24 0440 03/16/24 0351 03/17/24 0500  Weight: 95.2 kg 95.9 kg 93.7 kg   Physical Exam Constitutional:      General: He is not in acute distress.    Appearance: Normal appearance. He is not toxic-appearing.  HENT:     Head: Normocephalic and atraumatic.     Right Ear: External ear normal.     Left Ear: External ear normal.     Nose:  Nose normal.  Eyes:     Extraocular Movements: Extraocular movements intact.     Conjunctiva/sclera: Conjunctivae normal.     Pupils: Pupils are equal, round, and reactive to light.  Neck:     Comments: And midline Cardiovascular:     Rate and Rhythm: Normal rate and regular rhythm.  Pulmonary:     Effort: Pulmonary effort is normal.     Breath sounds: Normal breath sounds.  Abdominal:     General: Bowel sounds are normal.     Palpations: Abdomen is soft.  Musculoskeletal:        General: No swelling or signs of injury.     Cervical back: Normal range of motion.     Right lower leg: No edema.     Left lower leg: No edema.     Comments: Right ankle deformity is present and this is chronic.  Skin:    General: Skin is warm and dry.     Capillary  Refill: Capillary refill takes less than 2 seconds.  Neurological:     General: No focal deficit present.     Mental Status: He is alert and oriented to person, place, and time.  Psychiatric:        Mood and Affect: Mood normal.        Behavior: Behavior normal.   Condition at discharge: good  The results of significant diagnostics from this hospitalization (including imaging, microbiology, ancillary and laboratory) are listed below for reference.   Imaging Studies: ECHOCARDIOGRAM COMPLETE BUBBLE STUDY Result Date: 03/15/2024    ECHOCARDIOGRAM REPORT   Patient Name:   Bralynn Donado. Date of Exam: 03/14/2024 Medical Rec #:  [id number]             Height:       72.0 in Accession #:    [id number]            Weight:       205.7 lb Date of Birth:  20-Sep-1960             BSA:          2.156 m Patient Age:    63 years              BP:           158/103 mmHg Patient Gender: M                     HR:           84 bpm. Exam Location:  [facility] Procedure: 2D Echo, Cardiac Doppler, Color Doppler and Saline Contrast Bubble            Study (Both Spectral and Color Flow Doppler were utilized during            procedure). Indications:     Stroke I63.9  History:         Patient has no prior history of Echocardiogram examinations.  Sonographer:     Thedora Louder RDCS, FASE Referring Phys:  [id number] MORENE BATHE Diagnosing Phys: Marsa Dooms MD  Sonographer Comments: Suboptimal apical window and suboptimal subcostal window. The patient was unable to turn on his left side due to recent stroke. The patient was positioned supine for this study. IMPRESSIONS  1. Left ventricular ejection fraction, by estimation, is 55 to 60%. The left ventricle has normal function. The left ventricle has no regional wall motion abnormalities. Left ventricular diastolic parameters are consistent with Grade I diastolic dysfunction (impaired  relaxation).  2. Right ventricular systolic function is normal. The right ventricular size is  normal.  3. The mitral valve is normal in structure. Trivial mitral valve regurgitation. No evidence of mitral stenosis.  4. The aortic valve is normal in structure. Aortic valve regurgitation is not visualized. No aortic stenosis is present.  5. The inferior vena cava is normal in size with greater than 50% respiratory variability, suggesting right atrial pressure of 3 mmHg.  6. Agitated saline contrast bubble study was negative, with no evidence of any interatrial shunt. FINDINGS  Left Ventricle: Left ventricular ejection fraction, by estimation, is 55 to 60%. The left ventricle has normal function. The left ventricle has no regional wall motion abnormalities. Strain was performed and the global longitudinal strain is indeterminate. The left ventricular internal cavity size was normal in size. There is no left ventricular hypertrophy. Left ventricular diastolic parameters are consistent with Grade I diastolic dysfunction (impaired relaxation). Right Ventricle: The right ventricular size is normal. No increase in right ventricular wall thickness. Right ventricular systolic function is normal. Left Atrium: Left atrial size was normal in size. Right Atrium: Right atrial size was normal in size. Pericardium: There is no evidence of pericardial effusion. Mitral Valve: The mitral valve is normal in structure. Trivial mitral valve regurgitation. No evidence of mitral valve stenosis. Tricuspid Valve: The tricuspid valve is normal in structure. Tricuspid valve regurgitation is trivial. No evidence of tricuspid stenosis. Aortic Valve: The aortic valve is normal in structure. Aortic valve regurgitation is not visualized. No aortic stenosis is present. Pulmonic Valve: The pulmonic valve was normal in structure. Pulmonic valve regurgitation is not visualized. No evidence of pulmonic stenosis. Aorta: The aortic root is normal in size and structure. Venous: The inferior vena cava is normal in size with greater than 50%  respiratory variability, suggesting right atrial pressure of 3 mmHg. IAS/Shunts: No atrial level shunt detected by color flow Doppler. Agitated saline contrast was given intravenously to evaluate for intracardiac shunting. Agitated saline contrast bubble study was negative, with no evidence of any interatrial shunt. Additional Comments: 3D was performed not requiring image post processing on an independent workstation and was indeterminate.  LEFT VENTRICLE PLAX 2D LVIDd:         5.00 cm     Diastology LVIDs:         3.50 cm     LV e' medial:    6.42 cm/s LV PW:         1.30 cm     LV E/e' medial:  8.0 LV IVS:        1.30 cm     LV e' lateral:   11.40 cm/s LVOT diam:     1.90 cm     LV E/e' lateral: 4.5 LVOT Area:     2.84 cm  LV Volumes (MOD) LV vol d, MOD A4C: 37.4 ml LV SV MOD A4C:     37.4 ml LEFT ATRIUM         Index LA diam:    4.30 cm 1.99 cm/m                        PULMONIC VALVE AORTA                 PV Vmax:        0.95 m/s Ao Root diam: 4.00 cm PV Peak grad:   3.6 mmHg Ao Asc diam:  3.10 cm RVOT Peak grad: 4 mmHg  MITRAL VALVE MV Area (PHT): 3.21 cm    SHUNTS MV Decel Time: 236 msec    Systemic Diam: 1.90 cm MV E velocity: 51.40 cm/s MV A velocity: 69.40 cm/s MV E/A ratio:  0.74 Marsa Dooms MD Electronically signed by Marsa Dooms MD Signature Date/Time: 03/15/2024/1:03:35 PM    Final    CT HEAD WO CONTRAST ( ) Result Date: 03/14/2024 EXAM: CT HEAD WITHOUT 03/14/2024 11:12:52 AM TECHNIQUE: CT of the head was performed without the administration of intravenous contrast. Automated exposure control, iterative reconstruction, and/or weight based adjustment of the mA/kV was utilized to reduce the radiation dose to as low as reasonably achievable. COMPARISON: Head CT and MRI 03/13/2024. CLINICAL HISTORY: Neurological deficit, acute, stroke suspected. Facial droop. FINDINGS: BRAIN AND VENTRICLES: Hypodensity in the right paracentral pons corresponds to the acute infarct on MRI. No acute  supratentorial infarct, intracranial hemorrhage, mass, midline shift, hydrocephalus, or extra-axial fluid collection is identified. A small chronic left cerebellar infarct is again noted. Patchy hypodensities in the cerebral white matter bilaterally are nonspecific but compatible with mild to moderate chronic small vessel ischemic disease. There is mild cerebral atrophy. Calcified atherosclerosis at the skull base. ORBITS: No acute abnormality. SINUSES AND MASTOIDS: Chronic sinusitis including persistent complete opacification of the frontal sinus and extensive mucosal thickening in the right maxillary sinus. Unchanged left sphenoid sinus fluid. Clear mastoid air cells. SOFT TISSUES AND SKULL: No acute skull fracture. No acute soft tissue abnormality. IMPRESSION: 1. Known acute right pontine infarct. 2. Mild to moderate chronic small vessel ischemic disease. Electronically signed by: Dasie Hamburg MD 03/14/2024 11:35 AM EST RP Workstation: [id number]   MR BRAIN WO CONTRAST Result Date: 03/13/2024 EXAM: MRI BRAIN WITHOUT CONTRAST 03/13/2024 09:02:44 PM TECHNIQUE: Multiplanar multisequence MRI of the head/brain was performed without the administration of intravenous contrast. COMPARISON: None available. CLINICAL HISTORY: Neuro deficit, acute, stroke suspected. FINDINGS: BRAIN AND VENTRICLES: Acute infarct within the right pons of intermediate size. No other area of acute ischemia. Multifocal hyperintense T2-weighted signal within the cerebral white matter, most commonly due to chronic small vessel disease. Generalized volume loss. 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 No intracranial hemorrhage, mass, midline shift, or hydrocephalus. The sella is unremarkable. Normal flow voids. ORBITS: No acute abnormality. SINUSES AND MASTOIDS: Right maxillary and left sphenoid sinus mucosal thickening and opacification of the frontal sinus. BONES AND SOFT TISSUES: Normal marrow signal. No acute soft tissue abnormality.  IMPRESSION: 1. 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 2. Multifocal cerebral white matter T2 hyperintensities consistent with chronic small vessel disease. 3. Generalized cerebral volume loss. Electronically signed by: Franky Stanford MD 03/13/2024 09:07 PM EST RP Workstation: [id number]   CT ANGIO HEAD NECK W WO CM Result Date: 03/13/2024 EXAM: CTA HEAD AND NECK WITHOUT AND WITH 03/13/2024 04:45:36 PM TECHNIQUE: CTA of the head and neck was performed without and with the administration of 75 mL of iohexol  (OMNIPAQUE ) 350 MG/ML injection. Multiplanar 2D and/or 3D reformatted images are provided for review. Automated exposure control, iterative reconstruction, and/or weight based adjustment of the mA/kV was utilized to reduce the radiation dose to as low as reasonably achievable. Stenosis of the internal carotid arteries measured using NASCET criteria. COMPARISON: None available CLINICAL HISTORY: Neuro deficit, acute, stroke suspected. New onset of left sided facial droop, slurred speech, and left leg numbness beginning 2 days ago. FINDINGS: CTA NECK: AORTIC ARCH AND ARCH VESSELS: No dissection or arterial injury. No significant stenosis of the brachiocephalic or subclavian arteries. CERVICAL CAROTID ARTERIES: No dissection or  arterial injury. Small to moderate amount of calcified greater than soft plaque in the distal common and proximal internal carotid arteries on the right and a smaller amount of calcified plaque on the left. No hemodynamically significant stenosis by NASCET criteria. CERVICAL VERTEBRAL ARTERIES: Dominant left vertebral artery. Calcified plaque at the origin of the right vertebral artery results in moderate to severe stenosis. No dissection or arterial injury. LUNGS AND MEDIASTINUM: Unremarkable. SOFT TISSUES: No acute abnormality. BONES: Numerous dental caries. Interbody and facet ankylosis at C4-C5 and C5-C6. Asymmetrically severe left facet arthrosis at C2-C3 and C3-C4. Multilevel  bridging anterior vertebral osteophytes in the cervical and upper thoracic spine. CTA HEAD: ANTERIOR CIRCULATION: The intracranial internal carotid arteries are patent with calcified plaque resulting in mild to moderate proximal left supraclinoid stenosis and no significant stenosis on the right. ACAs and MCAs are patent without evidence of a proximal branch occlusion or significant proximal stenosis. No aneurysm. POSTERIOR CIRCULATION: The intracranial vertebral arteries are patent with calcified plaque resulting in mild stenosis on the left. Patent PICA and SCA origins are visualized bilaterally. The basilar artery is widely patent. Posterior communicating arteries are diminutive or absent. Both PCAs are patent without evidence of a significant proximal stenosis. Posterior communicating arteries are diminutive or absent. No aneurysm. OTHER: No dural venous sinus thrombosis on this non-dedicated study. IMPRESSION: 1. No large vessel occlusion. 2. 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 3. 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 Electronically signed by: Dasie Hamburg MD 03/13/2024 05:09 PM EST RP Workstation: [id number]   CT HEAD WO CONTRAST Result Date: 03/13/2024 EXAM: CT HEAD WITHOUT 03/13/2024 03:36:00 PM TECHNIQUE: CT of the head was performed without the administration of intravenous contrast. Automated exposure control, iterative reconstruction, and/or weight based adjustment of the mA/kV was utilized to reduce the radiation dose to as low as reasonably achievable. COMPARISON: None available. CLINICAL HISTORY: Neuro deficit, acute, stroke suspected. Left sided weakness. Altered mental status. FINDINGS: BRAIN AND VENTRICLES: There is no evidence of an acute infarct, intracranial hemorrhage, mass, midline shift, hydrocephalus, or extra-axial fluid collection. There is mild cerebral atrophy. Cerebral white matter hypodensities are nonspecific but  compatible with mild chronic small vessel ischemic disease. Calcified atherosclerosis at the skull base. ORBITS: No acute abnormality. SINUSES AND MASTOIDS: Complete opacification of the frontal sinus with surrounding osteitis consistent with chronic sinusitis. Moderate bilateral ethmoid air cell opacification. Prominent circumferential mucosal thickening in the right maxillary sinus. Large fluid level in the left sphenoid sinus. Clear mastoid air cells. SOFT TISSUES AND SKULL: No acute skull fracture. No acute soft tissue abnormality. IMPRESSION: 1. No acute intracranial abnormality. 2. Mild chronic small vessel ischemic disease. 3. Pansinusitis. Electronically signed by: Dasie Hamburg MD 03/13/2024 03:51 PM EST RP Workstation: [id number]   Microbiology: Results for orders placed or performed during the hospital encounter of 02/11/22  Surgical PCR screen     Status: None   Collection Time: 02/12/22  6:53 AM   Specimen: Nasal Mucosa; Nasal Swab  Result Value Ref Range Status   MRSA, PCR NEGATIVE NEGATIVE Final   Staphylococcus aureus NEGATIVE NEGATIVE Final    Comment: (NOTE) The Xpert SA Assay (FDA approved for NASAL specimens in patients 63 years of age and older), is one component of a comprehensive surveillance program. It is not intended to diagnose infection nor to guide or monitor treatment. Performed at [facility], [address] [place], [place] [address]    Labs: CBC: Recent Labs  Lab 03/13/24 1452 03/14/24 0502  WBC 7.6 5.3  NEUTROABS 6.0  --   HGB 15.4 14.7  HCT 43.3 41.9  MCV 95.6 96.3  PLT 159 155   Basic Metabolic Panel: Recent Labs  Lab 03/13/24 1452 03/13/24 2027 03/14/24 0502  NA 136  --  136  K 4.1  --  3.5  CL 96*  --  99  CO2 20*  --  23  GLUCOSE 153*  --  128*  BUN 15  --  15  CREATININE 0.95  --  0.84  CALCIUM  9.4  --  9.5  MG  --  2.0  --    Liver Function Tests: Recent Labs  Lab 03/13/24 1452  AST 60*  ALT 63*  ALKPHOS 75   BILITOT 1.7*  PROT 7.2  ALBUMIN 4.6   CBG: Recent Labs  Lab 03/16/24 0724 03/16/24 1150 03/16/24 1504 03/16/24 2047 03/17/24 0814  GLUCAP 142* 153* 145* 139* 130*   Discharge time spent: greater than 30 minutes.  Signed: Dr. Sherre [facility] 03/17/2024

## 2024-03-17 NOTE — Assessment & Plan Note (Signed)
Lisinopril 5 mg daily.

## 2024-03-17 NOTE — Assessment & Plan Note (Signed)
 Acute right pontine infarct Atorvastatin  40 mg daily Prophylactic therapy-dual antiplatelet therapy with aspirin  81 mg daily and Plavix  75 mg for 3 weeks with change to aspirin  81 mg alone after 3 weeks Continue telemetry monitoring Frequent neurochecks Outpatient follow-up with neurology per inpatient neurologist arrangement

## 2024-03-17 NOTE — Progress Notes (Signed)
 Physical Therapy Treatment Patient Details Name: Terrence Sanchez. MRN: [id number] DOB: 22-Jul-1960 Today's Date: 03/17/2024   History of Present Illness Pt is a 63 y.o. male presenting to ED for evaluation of L-sided facial droop, weakness, loss of sensation and slurred speech. Pt additionally has a full thickness R foot ulcer. CT head neg, MRI showed acute R pontine infarct. PMH significant for DM, multiple failed surgeries for R ankle fx, and R hip ORIF 2023.    PT Comments  Pt received in bed for co-tx with OT for +2 assist to safely progress strength, balance, and functional mobility. Pt appears to have some return strength in L LE, able to complete L LAQ sitting EOB, AA L heel slide in bed with good eccentric active knee/hip extension. No standing trial today until new supportive aircast boot can be brought up. Discussed with Dr. Sherre who was in agreement. Focused sessio on sitting trunk strengthening, dynamic reaching, and balance. Pt continues to progress well, remains very motivated.    If plan is discharge home, recommend the following: Two people to help with walking and/or transfers;Two people to help with bathing/dressing/bathroom;Assistance with cooking/housework;Assistance with feeding;Direct supervision/assist for medications management;Assist for transportation;Help with stairs or ramp for entrance   Can travel by private vehicle     No  Equipment Recommendations  Other (comment) (TBD at next level of care)    Recommendations for Other Services       Precautions / Restrictions Precautions Precautions: Fall Recall of Precautions/Restrictions: Intact Precaution/Restrictions Comments: LUE hemiparesis Restrictions Weight Bearing Restrictions Per Provider Order: No Other Position/Activity Restrictions:  (Chronic Righ foot pain, s/p ankle fusion with deformity. New Aicast boot issued)     Mobility  Bed Mobility Overal bed mobility: Needs Assistance Bed Mobility: Supine  to Sit, Sit to Supine     Supine to sit: Mod assist, +2 for physical assistance Sit to supine: Mod assist, +2 for physical assistance   General bed mobility comments: assist for trunkal elevation and return to supine as well as cues and assist to hook LLE with RLE to manage it on/off bed; increased assist for all scooting tasks    Transfers                   General transfer comment:  (Did not attempt until new boot arrives)    Ambulation/Gait               General Gait Details:  (NT- usafe)   Stairs             Wheelchair Mobility     Tilt Bed    Modified Rankin (Stroke Patients Only)       Balance Overall balance assessment: Needs assistance Sitting-balance support: No upper extremity supported, Feet supported Sitting balance-Leahy Scale: Good Sitting balance - Comments: Multiple strengthening and dynamic reaching activities in sitting with CG/MinA for balance and postural control                                    Communication Communication Communication: Impaired Factors Affecting Communication: Reduced clarity of speech  Cognition                                        Cueing    Exercises General Exercises - Lower Extremity Ankle Circles/Pumps: PROM, 10  reps, Left Long Arc Quad: AROM, Left, 10 reps, Seated Heel Slides: AAROM, Left, 5 reps Other Exercises Other Exercises:  (Seated trunk exercises with CGA/MinA x 2 for balance and postural control) Other Exercises: Pt educated on role of acute PT, aquiring new shoe for Right foot, LE HEP    General Comments General comments (skin integrity, edema, etc.):  (Right foot Aircast boot aquired to provide better support at foot and ankle after verbal discussion with Dr. Sherre.)      Pertinent Vitals/Pain Pain Assessment Pain Assessment: Faces Faces Pain Scale: Hurts a little bit Pain Location: R foot with WB Pain Descriptors / Indicators: Discomfort     Home Living                          Prior Function            PT Goals (current goals can now be found in the care plan section) Acute Rehab PT Goals Patient Stated Goal: to get better Progress towards PT goals: Progressing toward goals    Frequency    Min 3X/week      PT Plan      Co-evaluation   Reason for Co-Treatment: Complexity of the patient's impairments (multi-system involvement);To address functional/ADL transfers PT goals addressed during session: Mobility/safety with mobility OT goals addressed during session: ADL's and self-care      AM-PAC PT 6 Clicks Mobility   Outcome Measure  Help needed turning from your back to your side while in a flat bed without using bedrails?: A Lot Help needed moving from lying on your back to sitting on the side of a flat bed without using bedrails?: A Lot Help needed moving to and from a bed to a chair (including a wheelchair)?: Total Help needed standing up from a chair using your arms (e.g., wheelchair or bedside chair)?: Total Help needed to walk in hospital room?: Total Help needed climbing 3-5 steps with a railing? : Total 6 Click Score: 8    End of Session   Activity Tolerance: Patient tolerated treatment well Patient left: in bed;with call bell/phone within reach;with bed alarm set Nurse Communication: Mobility status PT Visit Diagnosis: Muscle weakness (generalized) (M62.81);Difficulty in walking, not elsewhere classified (R26.2);Hemiplegia and hemiparesis;Other symptoms and signs involving the nervous system (R29.898);Unsteadiness on feet (R26.81) Hemiplegia - Right/Left: Left Hemiplegia - dominant/non-dominant: Non-dominant Hemiplegia - caused by: Cerebral infarction     Time: 9070-8992 PT Time Calculation (min) (ACUTE ONLY): 38 min  Charges:    $Therapeutic Activity: 23-37 mins                      Darice Bohr, PTA  Darice JAYSON Bohr 03/17/2024, 11:44 AM

## 2024-03-17 NOTE — TOC Transition Note (Signed)
 Transition of Care [facility]) - Discharge Note   Patient Details  Name: Terrence Sanchez. MRN: [id number] Date of Birth: 03-16-1961  Transition of Care [facility]) CM/SW Contact:  Dalia GORMAN Fuse, RN Phone Number: 03/17/2024, 10:59 AM   Clinical Narrative:     Patient is medically clear to discharge to [facility] for STR. [facility] to transport.   Nurse to call report to [facility] [phone]  Final next level of care: Skilled Nursing Facility Barriers to Discharge: Barriers Resolved   Patient Goals and CMS Choice            Discharge Placement              Patient chooses bed at: [facility] Patient to be transferred to facility by: [facility]   Patient and family notified of of transfer: 03/17/24  Discharge Plan and Services Additional resources added to the After Visit Summary for                                       Social Drivers of Health (SDOH) Interventions SDOH Screenings   Food Insecurity: No Food Insecurity (03/13/2024)  Housing: Low Risk (03/13/2024)  Transportation Needs: No Transportation Needs (03/13/2024)  Utilities: Not At Risk (03/13/2024)  Social Connections: Socially Isolated (03/13/2024)  Tobacco Use: Low Risk (03/17/2024)     Readmission Risk Interventions     No data to display

## 2024-03-17 NOTE — Care Management Important Message (Signed)
 Important Message  Patient Details  Name: Terrence Sanchez. MRN: [id number] Date of Birth: Jan 01, 1961   Important Message Given:  Yes - Medicare IM     Tavoris Brisk W, CMA 03/17/2024, 11:38 AM
